# Patient Record
Sex: Female | Born: 1965 | Race: Black or African American | Hispanic: No | Marital: Single | Smoking: Current every day smoker
Health system: Southern US, Community
[De-identification: ages and names within clinical notes are randomized; demographics above are authoritative.]

## PROBLEM LIST (undated history)

## (undated) DIAGNOSIS — R011 Cardiac murmur, unspecified: Secondary | ICD-10-CM

## (undated) DIAGNOSIS — F32A Depression, unspecified: Secondary | ICD-10-CM

## (undated) DIAGNOSIS — T1491XA Suicide attempt, initial encounter: Secondary | ICD-10-CM

## (undated) DIAGNOSIS — F329 Major depressive disorder, single episode, unspecified: Secondary | ICD-10-CM

## (undated) DIAGNOSIS — Z21 Asymptomatic human immunodeficiency virus [HIV] infection status: Secondary | ICD-10-CM

## (undated) DIAGNOSIS — F319 Bipolar disorder, unspecified: Secondary | ICD-10-CM

## (undated) DIAGNOSIS — E7800 Pure hypercholesterolemia, unspecified: Secondary | ICD-10-CM

## (undated) DIAGNOSIS — I10 Essential (primary) hypertension: Secondary | ICD-10-CM

## (undated) DIAGNOSIS — K219 Gastro-esophageal reflux disease without esophagitis: Secondary | ICD-10-CM

## (undated) DIAGNOSIS — K859 Acute pancreatitis without necrosis or infection, unspecified: Secondary | ICD-10-CM

## (undated) HISTORY — PX: ECTOPIC PREGNANCY SURGERY: SHX613

---

## 1999-03-09 ENCOUNTER — Emergency Department (HOSPITAL_COMMUNITY): Admission: EM | Admit: 1999-03-09 | Discharge: 1999-03-09 | Payer: Self-pay | Admitting: Emergency Medicine

## 1999-03-10 ENCOUNTER — Emergency Department (HOSPITAL_COMMUNITY): Admission: EM | Admit: 1999-03-10 | Discharge: 1999-03-10 | Payer: Self-pay

## 1999-03-10 ENCOUNTER — Encounter: Payer: Self-pay | Admitting: Emergency Medicine

## 1999-05-24 ENCOUNTER — Emergency Department (HOSPITAL_COMMUNITY): Admission: EM | Admit: 1999-05-24 | Discharge: 1999-05-24 | Payer: Self-pay | Admitting: Emergency Medicine

## 1999-05-24 ENCOUNTER — Encounter: Payer: Self-pay | Admitting: *Deleted

## 1999-06-15 ENCOUNTER — Emergency Department (HOSPITAL_COMMUNITY): Admission: EM | Admit: 1999-06-15 | Discharge: 1999-06-15 | Payer: Self-pay | Admitting: Emergency Medicine

## 2004-12-29 ENCOUNTER — Emergency Department (HOSPITAL_COMMUNITY): Admission: EM | Admit: 2004-12-29 | Discharge: 2004-12-29 | Payer: Self-pay | Admitting: *Deleted

## 2005-01-02 ENCOUNTER — Ambulatory Visit: Payer: Self-pay | Admitting: Nurse Practitioner

## 2005-01-02 ENCOUNTER — Ambulatory Visit: Payer: Self-pay | Admitting: *Deleted

## 2005-02-02 ENCOUNTER — Emergency Department (HOSPITAL_COMMUNITY): Admission: EM | Admit: 2005-02-02 | Discharge: 2005-02-02 | Payer: Self-pay | Admitting: Family Medicine

## 2011-12-02 ENCOUNTER — Emergency Department (HOSPITAL_COMMUNITY)
Admission: EM | Admit: 2011-12-02 | Discharge: 2011-12-02 | Disposition: A | Discharge status: Other Acute Inpatient Hospital | Payer: PRIVATE HEALTH INSURANCE | Attending: Emergency Medicine | Admitting: Emergency Medicine

## 2011-12-02 ENCOUNTER — Emergency Department (HOSPITAL_COMMUNITY): Payer: PRIVATE HEALTH INSURANCE

## 2011-12-02 ENCOUNTER — Inpatient Hospital Stay (HOSPITAL_COMMUNITY)
Admission: AD | Admit: 2011-12-02 | Discharge: 2011-12-27 | DRG: 885 | Disposition: A | Discharge status: Home / Self Care | Payer: PRIVATE HEALTH INSURANCE | Source: Ambulatory Visit | Attending: Psychiatry | Admitting: Psychiatry

## 2011-12-02 ENCOUNTER — Encounter (HOSPITAL_COMMUNITY): Payer: Self-pay | Admitting: *Deleted

## 2011-12-02 DIAGNOSIS — F1420 Cocaine dependence, uncomplicated: Secondary | ICD-10-CM | POA: Diagnosis present

## 2011-12-02 DIAGNOSIS — Z21 Asymptomatic human immunodeficiency virus [HIV] infection status: Secondary | ICD-10-CM | POA: Diagnosis present

## 2011-12-02 DIAGNOSIS — F32A Depression, unspecified: Secondary | ICD-10-CM

## 2011-12-02 DIAGNOSIS — Z79899 Other long term (current) drug therapy: Secondary | ICD-10-CM

## 2011-12-02 DIAGNOSIS — Z91199 Patient's noncompliance with other medical treatment and regimen due to unspecified reason: Secondary | ICD-10-CM

## 2011-12-02 DIAGNOSIS — R21 Rash and other nonspecific skin eruption: Secondary | ICD-10-CM | POA: Insufficient documentation

## 2011-12-02 DIAGNOSIS — K219 Gastro-esophageal reflux disease without esophagitis: Secondary | ICD-10-CM | POA: Diagnosis present

## 2011-12-02 DIAGNOSIS — F6089 Other specific personality disorders: Secondary | ICD-10-CM | POA: Diagnosis present

## 2011-12-02 DIAGNOSIS — K5900 Constipation, unspecified: Secondary | ICD-10-CM | POA: Diagnosis present

## 2011-12-02 DIAGNOSIS — B9689 Other specified bacterial agents as the cause of diseases classified elsewhere: Secondary | ICD-10-CM | POA: Diagnosis present

## 2011-12-02 DIAGNOSIS — E7800 Pure hypercholesterolemia, unspecified: Secondary | ICD-10-CM | POA: Diagnosis present

## 2011-12-02 DIAGNOSIS — L298 Other pruritus: Secondary | ICD-10-CM | POA: Insufficient documentation

## 2011-12-02 DIAGNOSIS — F411 Generalized anxiety disorder: Secondary | ICD-10-CM | POA: Insufficient documentation

## 2011-12-02 DIAGNOSIS — F1410 Cocaine abuse, uncomplicated: Secondary | ICD-10-CM

## 2011-12-02 DIAGNOSIS — I10 Essential (primary) hypertension: Secondary | ICD-10-CM | POA: Diagnosis present

## 2011-12-02 DIAGNOSIS — F339 Major depressive disorder, recurrent, unspecified: Secondary | ICD-10-CM

## 2011-12-02 DIAGNOSIS — F329 Major depressive disorder, single episode, unspecified: Secondary | ICD-10-CM

## 2011-12-02 DIAGNOSIS — B20 Human immunodeficiency virus [HIV] disease: Secondary | ICD-10-CM

## 2011-12-02 DIAGNOSIS — Z9119 Patient's noncompliance with other medical treatment and regimen: Secondary | ICD-10-CM

## 2011-12-02 DIAGNOSIS — L2989 Other pruritus: Secondary | ICD-10-CM | POA: Insufficient documentation

## 2011-12-02 DIAGNOSIS — F10939 Alcohol use, unspecified with withdrawal, unspecified: Secondary | ICD-10-CM | POA: Diagnosis present

## 2011-12-02 DIAGNOSIS — F1020 Alcohol dependence, uncomplicated: Secondary | ICD-10-CM | POA: Diagnosis present

## 2011-12-02 DIAGNOSIS — N760 Acute vaginitis: Secondary | ICD-10-CM | POA: Diagnosis present

## 2011-12-02 DIAGNOSIS — F10239 Alcohol dependence with withdrawal, unspecified: Secondary | ICD-10-CM | POA: Diagnosis present

## 2011-12-02 DIAGNOSIS — F17200 Nicotine dependence, unspecified, uncomplicated: Secondary | ICD-10-CM | POA: Diagnosis present

## 2011-12-02 DIAGNOSIS — R011 Cardiac murmur, unspecified: Secondary | ICD-10-CM | POA: Diagnosis present

## 2011-12-02 DIAGNOSIS — F1010 Alcohol abuse, uncomplicated: Secondary | ICD-10-CM | POA: Insufficient documentation

## 2011-12-02 DIAGNOSIS — F1910 Other psychoactive substance abuse, uncomplicated: Secondary | ICD-10-CM

## 2011-12-02 DIAGNOSIS — F39 Unspecified mood [affective] disorder: Secondary | ICD-10-CM

## 2011-12-02 DIAGNOSIS — F1210 Cannabis abuse, uncomplicated: Secondary | ICD-10-CM

## 2011-12-02 DIAGNOSIS — F3130 Bipolar disorder, current episode depressed, mild or moderate severity, unspecified: Secondary | ICD-10-CM | POA: Insufficient documentation

## 2011-12-02 DIAGNOSIS — F429 Obsessive-compulsive disorder, unspecified: Secondary | ICD-10-CM | POA: Diagnosis present

## 2011-12-02 DIAGNOSIS — A6000 Herpesviral infection of urogenital system, unspecified: Secondary | ICD-10-CM | POA: Diagnosis present

## 2011-12-02 DIAGNOSIS — F332 Major depressive disorder, recurrent severe without psychotic features: Principal | ICD-10-CM | POA: Diagnosis present

## 2011-12-02 DIAGNOSIS — L989 Disorder of the skin and subcutaneous tissue, unspecified: Secondary | ICD-10-CM | POA: Diagnosis present

## 2011-12-02 HISTORY — DX: Cardiac murmur, unspecified: R01.1

## 2011-12-02 HISTORY — DX: Suicide attempt, initial encounter: T14.91XA

## 2011-12-02 HISTORY — DX: Pure hypercholesterolemia, unspecified: E78.00

## 2011-12-02 HISTORY — DX: Bipolar disorder, unspecified: F31.9

## 2011-12-02 HISTORY — DX: Acute pancreatitis without necrosis or infection, unspecified: K85.90

## 2011-12-02 HISTORY — DX: Depression, unspecified: F32.A

## 2011-12-02 HISTORY — DX: Asymptomatic human immunodeficiency virus (hiv) infection status: Z21

## 2011-12-02 HISTORY — DX: Major depressive disorder, single episode, unspecified: F32.9

## 2011-12-02 HISTORY — DX: Gastro-esophageal reflux disease without esophagitis: K21.9

## 2011-12-02 HISTORY — DX: Essential (primary) hypertension: I10

## 2011-12-02 LAB — COMPREHENSIVE METABOLIC PANEL
ALT: 14 U/L (ref 0–35)
AST: 22 U/L (ref 0–37)
Albumin: 3.8 g/dL (ref 3.5–5.2)
Alkaline Phosphatase: 66 U/L (ref 39–117)
BUN: 11 mg/dL (ref 6–23)
CO2: 23 mEq/L (ref 19–32)
Calcium: 9.1 mg/dL (ref 8.4–10.5)
Chloride: 104 mEq/L (ref 96–112)
Creatinine, Ser: 0.85 mg/dL (ref 0.50–1.10)
GFR calc Af Amer: >90 mL/min (ref >90)
GFR calc non Af Amer: 80 mL/min — ABNORMAL LOW (ref >90)
Glucose, Bld: 92 mg/dL (ref 70–99)
Potassium: 3.9 mEq/L (ref 3.5–5.1)
Sodium: 137 mEq/L (ref 135–145)
Total Bilirubin: 0.3 mg/dL (ref 0.3–1.2)
Total Protein: 7.5 g/dL (ref 6.0–8.3)

## 2011-12-02 LAB — WET PREP, GENITAL
Trich, Wet Prep: NONE SEEN
Yeast Wet Prep HPF POC: NONE SEEN

## 2011-12-02 LAB — URINALYSIS, ROUTINE W REFLEX MICROSCOPIC
Bilirubin Urine: NEGATIVE
Glucose, UA: NEGATIVE mg/dL
Hgb urine dipstick: NEGATIVE
Ketones, ur: NEGATIVE mg/dL
Leukocytes, UA: NEGATIVE
Nitrite: NEGATIVE
Protein, ur: NEGATIVE mg/dL
Specific Gravity, Urine: 1.025 (ref 1.005–1.030)
Urobilinogen, UA: 0.2 mg/dL (ref 0.0–1.0)
pH: 5 (ref 5.0–8.0)

## 2011-12-02 LAB — DIFFERENTIAL
Basophils Absolute: 0 10*3/uL (ref 0.0–0.1)
Basophils Relative: 0 % (ref 0–1)
Eosinophils Absolute: 0.2 10*3/uL (ref 0.0–0.7)
Eosinophils Relative: 4 % (ref 0–5)
Lymphocytes Relative: 32 % (ref 12–46)
Lymphs Abs: 1.5 10*3/uL (ref 0.7–4.0)
Monocytes Absolute: 0.3 10*3/uL (ref 0.1–1.0)
Monocytes Relative: 6 % (ref 3–12)
Neutro Abs: 2.7 10*3/uL (ref 1.7–7.7)
Neutrophils Relative %: 58 % (ref 43–77)

## 2011-12-02 LAB — CBC
HCT: 43.9 % (ref 36.0–46.0)
Hemoglobin: 14.6 g/dL (ref 12.0–15.0)
MCH: 30.9 pg (ref 26.0–34.0)
MCHC: 33.3 g/dL (ref 30.0–36.0)
MCV: 92.8 fL (ref 78.0–100.0)
Platelets: 302 10*3/uL (ref 150–400)
RBC: 4.73 MIL/uL (ref 3.87–5.11)
RDW: 12.6 % (ref 11.5–15.5)
WBC: 4.7 10*3/uL (ref 4.0–10.5)

## 2011-12-02 LAB — RAPID URINE DRUG SCREEN, HOSP PERFORMED
Amphetamines: NOT DETECTED
Barbiturates: NOT DETECTED
Benzodiazepines: NOT DETECTED
Cocaine: POSITIVE — AB
Opiates: NOT DETECTED
Tetrahydrocannabinol: NOT DETECTED

## 2011-12-02 LAB — RPR: RPR Ser Ql: NONREACTIVE

## 2011-12-02 LAB — PREGNANCY, URINE: Preg Test, Ur: NEGATIVE

## 2011-12-02 MED ORDER — SIMVASTATIN 10 MG PO TABS
10.0000 mg | ORAL_TABLET | Freq: Every day | ORAL | Status: DC
  Administered 2011-12-02: 10 mg via ORAL
  Filled 2011-12-02: qty 1

## 2011-12-02 MED ORDER — EFAVIRENZ-EMTRICITAB-TENOFOVIR 600-200-300 MG PO TABS
1.0000 | ORAL_TABLET | Freq: Every day | ORAL | Status: DC
  Administered 2011-12-02: 1 via ORAL
  Filled 2011-12-02: qty 1

## 2011-12-02 MED ORDER — DIPHENHYDRAMINE HCL 25 MG PO CAPS
25.0000 mg | ORAL_CAPSULE | Freq: Four times a day (QID) | ORAL | Status: DC | PRN
  Administered 2011-12-02: 25 mg via ORAL
  Filled 2011-12-02: qty 1

## 2011-12-02 MED ORDER — HYDROCHLOROTHIAZIDE 25 MG PO TABS
25.0000 mg | ORAL_TABLET | Freq: Every day | ORAL | Status: DC
  Administered 2011-12-02: 25 mg via ORAL
  Filled 2011-12-02: qty 1

## 2011-12-02 MED ORDER — ARIPIPRAZOLE 10 MG PO TABS
10.0000 mg | ORAL_TABLET | Freq: Every day | ORAL | Status: DC
  Administered 2011-12-02: 10 mg via ORAL
  Filled 2011-12-02: qty 1

## 2011-12-02 MED ORDER — ONDANSETRON 4 MG PO TBDP
4.0000 mg | ORAL_TABLET | Freq: Three times a day (TID) | ORAL | Status: DC | PRN
Start: 2011-12-02 — End: 2011-12-27
  Administered 2011-12-04 – 2011-12-26 (×6): 4 mg via ORAL
  Filled 2011-12-02 (×2): qty 1

## 2011-12-02 MED ORDER — CEFTRIAXONE SODIUM 250 MG IJ SOLR
250.0000 mg | Freq: Once | INTRAMUSCULAR | Status: AC
  Administered 2011-12-02: 250 mg via INTRAMUSCULAR
  Filled 2011-12-02: qty 250

## 2011-12-02 MED ORDER — RISPERIDONE 1 MG PO TABS
1.0000 mg | ORAL_TABLET | Freq: Once | ORAL | Status: AC
  Administered 2011-12-02: 1 mg via ORAL
  Filled 2011-12-02: qty 1

## 2011-12-02 MED ORDER — RISPERIDONE 1 MG PO TABS
1.0000 mg | ORAL_TABLET | Freq: Every day | ORAL | Status: DC
  Administered 2011-12-03 – 2011-12-04 (×2): 1 mg via ORAL
  Filled 2011-12-02 (×4): qty 1

## 2011-12-02 MED ORDER — QUETIAPINE FUMARATE ER 400 MG PO TB24
800.0000 mg | ORAL_TABLET | Freq: Every day | ORAL | Status: DC
  Filled 2011-12-02: qty 2

## 2011-12-02 MED ORDER — IBUPROFEN 600 MG PO TABS: 600.0000 mg | ORAL_TABLET | Freq: Three times a day (TID) | ORAL | Status: DC | PRN

## 2011-12-02 MED ORDER — AZITHROMYCIN 250 MG PO TABS
1000.0000 mg | ORAL_TABLET | Freq: Once | ORAL | Status: AC
  Administered 2011-12-02: 1000 mg via ORAL
  Filled 2011-12-02: qty 4

## 2011-12-02 MED ORDER — ALUM & MAG HYDROXIDE-SIMETH 200-200-20 MG/5ML PO SUSP: 30.0000 mL | ORAL | Status: DC | PRN

## 2011-12-02 MED ORDER — ACETAMINOPHEN 325 MG PO TABS
650.0000 mg | ORAL_TABLET | Freq: Four times a day (QID) | ORAL | Status: DC | PRN
  Administered 2011-12-08 – 2011-12-19 (×8): 650 mg via ORAL

## 2011-12-02 MED ORDER — METRONIDAZOLE 500 MG PO TABS
2000.0000 mg | ORAL_TABLET | Freq: Once | ORAL | Status: AC
  Administered 2011-12-02: 2000 mg via ORAL
  Filled 2011-12-02: qty 4

## 2011-12-02 MED ORDER — LORAZEPAM 1 MG PO TABS: 1.0000 mg | ORAL_TABLET | Freq: Three times a day (TID) | ORAL | Status: DC | PRN

## 2011-12-02 MED ORDER — ACETAMINOPHEN 325 MG PO TABS: 650.0000 mg | ORAL_TABLET | ORAL | Status: DC | PRN

## 2011-12-02 MED ORDER — MAGNESIUM HYDROXIDE 400 MG/5ML PO SUSP
30.0000 mL | Freq: Every day | ORAL | Status: DC | PRN
  Administered 2011-12-07 – 2011-12-16 (×5): 30 mL via ORAL

## 2011-12-02 MED ORDER — GABAPENTIN 300 MG PO CAPS
300.0000 mg | ORAL_CAPSULE | Freq: Three times a day (TID) | ORAL | Status: DC
  Administered 2011-12-02 (×2): 300 mg via ORAL
  Filled 2011-12-02 (×2): qty 1

## 2011-12-02 MED ORDER — EFAVIRENZ-EMTRICITAB-TENOFOVIR 600-200-300 MG PO TABS
1.0000 | ORAL_TABLET | Freq: Every day | ORAL | Status: DC
  Administered 2011-12-03 – 2011-12-23 (×21): 1 via ORAL
  Filled 2011-12-02 (×22): qty 1

## 2011-12-02 MED ORDER — DIPHENHYDRAMINE HCL 50 MG PO CAPS
50.0000 mg | ORAL_CAPSULE | Freq: Every evening | ORAL | Status: DC | PRN
  Filled 2011-12-02: qty 1

## 2011-12-02 MED ORDER — QUETIAPINE FUMARATE ER 400 MG PO TB24
800.0000 mg | ORAL_TABLET | Freq: Every day | ORAL | Status: DC
  Administered 2011-12-02: 800 mg via ORAL
  Filled 2011-12-02: qty 2

## 2011-12-02 MED ORDER — ONDANSETRON HCL 4 MG PO TABS
4.0000 mg | ORAL_TABLET | Freq: Three times a day (TID) | ORAL | Status: DC | PRN
  Administered 2011-12-02: 4 mg via ORAL
  Filled 2011-12-02: qty 1

## 2011-12-02 NOTE — ED Notes (Signed)
Report given  to nurse in Psych ED Toniann Fail, RN. Patient transferred to room 39

## 2011-12-02 NOTE — ED Notes (Signed)
Full rainbow sent to lab.  

## 2011-12-02 NOTE — Progress Notes (Signed)
Patient ID: Lacey Moses, female   DOB: [birth year], 46 y.o.   MRN: [id number] Patient admitted voluntarily for help with "Thirty something years" of substance abuse. Patient has been abusing ETOH/THC/cocaine. Denies SI/HI and AVH. The patient was very irritable during the admission process and was further annoyed by basic questions posed by Clinical research associate. She reported not having slept and was feeling very poorly. The patient was able to complete the admission process with support from staff. ED reported that patient had received several medications prior to arriving at [facility] including seroquel XR 800 mg, Risperdal 1mg , Zofran 4 mg, Neurontin 300 mg, and Atripla. Patient was concerned over not receiving her acyclovir and on call MD was contacted for further orders. The patient will be on the 400 hall. No signs of psychosis evident on admission. The patient expressed a need to use the phone and to get some sleep.

## 2011-12-02 NOTE — ED Notes (Signed)
ACT into see 

## 2011-12-02 NOTE — ED Notes (Signed)
Pt wearing wig-wig searched

## 2011-12-02 NOTE — ED Notes (Signed)
Pt from home with reports of requesting detox from alcohol, marijuana and cocaine. Pt reports last using cocaine and alcohol 3 days ago and marijuana a week ago. Pt cooperative but easily agitated at present. Pt denies SI/HI at present but reports suicide attempt 2 months ago by walking out in front of cars. Pt is homeless and has not been taking meds.

## 2011-12-02 NOTE — Tx Team (Signed)
Initial Interdisciplinary Treatment Plan  PATIENT STRENGTHS: (choose at least two) Ability for insight Average or above average intelligence Capable of independent living General fund of knowledge Religious Affiliation  PATIENT STRESSORS: Financial difficulties Health problems Substance abuse   PROBLEM LIST: Problem List/Patient Goals Date to be addressed Date deferred Reason deferred Estimated date of resolution        Substance Abuse     12/03/11           Major Depressive Disorder      12/03/11                                                DISCHARGE CRITERIA:  Ability to meet basic life and health needs Adequate post-discharge living arrangements Improved stabilization in mood, thinking, and/or behavior Medical problems require only outpatient monitoring Motivation to continue treatment in a less acute level of care Need for constant or close observation no longer present Reduction of life-threatening or endangering symptoms to within safe limits Safe-care adequate arrangements made Verbal commitment to aftercare and medication compliance Withdrawal symptoms are absent or subacute and managed without 24-hour nursing intervention  PRELIMINARY DISCHARGE PLAN: Return to previous living arrangement  PATIENT/FAMIILY INVOLVEMENT: This treatment plan has been presented to and reviewed with the patient, Lacey Moses, and/or family member.  The patient and family have been given the opportunity to ask questions and make suggestions.  Fransisca Kaufmann ANN 12/02/2011, 11:41 PM

## 2011-12-02 NOTE — ED Notes (Signed)
Pt belongings bag is locked in pt room cabinet #5
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
## 2011-12-02 NOTE — ED Notes (Signed)
Pt reports that she needs to be checked for STDs, Dr. Rubin Payor made aware and pt moved to room 5 for possible pelvic exam.

## 2011-12-02 NOTE — ED Notes (Signed)
1 bag locked in locker # 39 

## 2011-12-02 NOTE — [facility] Counselor (Signed)
Pt accepted to [facility], Jonnalagadda MD to [place] MD, bed 401-2. RN and EDP notified. All support paperwork faxed to [facility] and originals placed in pt's chart.

## 2011-12-02 NOTE — ED Provider Notes (Signed)
History     CSN: [id number]  Arrival date & time 12/02/11  1345   First MD Initiated Contact with Patient 12/02/11 1502      Chief Complaint  Patient presents with  . V70.1    (Consider location/radiation/quality/duration/timing/severity/associated sxs/prior treatment) The history is provided by the patient.   patient is requesting detox off of cocaine alcohol and marijuana. she primarily uses cocaine. She will also use alcohol and marijuana when she can get them. She she is now homeless after a breakup with her fianc and her relapse into drug use. She also has stopped taking all of her medications. She's not currently suicidal, but she is depressed. She has previous suicide attempts by walking from the cars. No fevers. No cough. No nausea vomiting diarrhea. He has had a rash for an unknown reason amount of time. She states that she got after she slept on an outside couch that you take $10 to sleep on. The rash itches. Patient now states that she wants to be checked for STDs.  Past Medical History  Diagnosis Date  . Depression   . Bipolar 1 disorder   . Suicide attempt   . HIV positive   . Hypertension   . High cholesterol   . Heart murmur   . Pancreatitis   . GERD (gastroesophageal reflux disease)     Past Surgical History  Procedure Date  . Ectopic pregnancy surgery     History reviewed. No pertinent family history.  History  Substance Use Topics  . Smoking status: Current Everyday Smoker -- 1.0 packs/day    Types: Cigarettes  . Smokeless tobacco: Never Used  . Alcohol Use: Yes     everything daily    OB History    Grav Para Term Preterm Abortions TAB SAB Ect Mult Living                  Review of Systems  Constitutional: Negative for activity change and appetite change.  HENT: Negative for neck stiffness.   Eyes: Negative for pain.  Respiratory: Negative for chest tightness and shortness of breath.   Cardiovascular: Negative for chest pain and leg  swelling.  Gastrointestinal: Negative for nausea, vomiting, abdominal pain and diarrhea.  Genitourinary: Negative for flank pain, vaginal bleeding and vaginal discharge.  Musculoskeletal: Negative for back pain.  Skin: Positive for rash.  Neurological: Negative for weakness, numbness and headaches.  Psychiatric/Behavioral: Negative for suicidal ideas and behavioral problems. The patient is nervous/anxious.     Allergies  Review of patient's allergies indicates no known allergies.  Home Medications   Current Outpatient Rx  Name Route Sig Dispense Refill  . ARIPIPRAZOLE 10 MG PO TABS Oral Take 10 mg by mouth daily.    . EFAVIRENZ-EMTRICITAB-TENOFOVIR 600-200-300 MG PO TABS Oral Take 1 tablet by mouth at bedtime.    Marland Kitchen FLUOCINONIDE 0.05 % EX SOLN Topical Apply 1 application topically every evening.    Marland Kitchen GABAPENTIN 300 MG PO CAPS Oral Take 300 mg by mouth 3 (three) times daily.    Marland Kitchen HYDROCHLOROTHIAZIDE 25 MG PO TABS Oral Take 25 mg by mouth daily.    Marland Kitchen PRAVASTATIN SODIUM 20 MG PO TABS Oral Take 20 mg by mouth daily.    . QUETIAPINE FUMARATE ER 400 MG PO TB24 Oral Take 800 mg by mouth at bedtime.    . TRETINOIN 0.025 % EX CREA Topical Apply 1 application topically at bedtime.      BP 115/73  Pulse 83  Temp(Src) 98.4  F (36.9 C) (Oral)  Resp 16  Wt 172 lb (78.019 kg)  SpO2 98%  Physical Exam  Nursing note and vitals reviewed. Constitutional: She is oriented to person, place, and time. She appears well-developed and well-nourished.  HENT:  Head: Normocephalic and atraumatic.  Eyes: EOM are normal. Pupils are equal, round, and reactive to light.  Neck: Normal range of motion. Neck supple.  Cardiovascular: Normal rate, regular rhythm and normal heart sounds.   No murmur heard. Pulmonary/Chest: Effort normal and breath sounds normal. No respiratory distress. She has no wheezes. She has no rales.  Abdominal: Soft. Bowel sounds are normal. She exhibits no distension. There is no  tenderness. There is no rebound and no guarding.  Musculoskeletal: Normal range of motion.  Neurological: She is alert and oriented to person, place, and time. No cranial nerve deficit.  Skin: Skin is warm and dry. Rash noted.       Patient has multiple small raise areas on her arms and trunkk. Some have been scratched and the top removed. No purulence.  Psychiatric: Her speech is normal.       Patient appears somewhat depressed and anxious.    ED Course  Procedures (including critical care time)  Labs Reviewed  COMPREHENSIVE METABOLIC PANEL - Abnormal; Notable for the following:    GFR calc non Af Amer 80 (*)    All other components within normal limits  URINE RAPID DRUG SCREEN (HOSP PERFORMED) - Abnormal; Notable for the following:    Cocaine POSITIVE (*)    All other components within normal limits  WET PREP, GENITAL - Abnormal; Notable for the following:    Clue Cells Wet Prep HPF POC FEW (*)    WBC, Wet Prep HPF POC FEW (*)    All other components within normal limits  CBC  DIFFERENTIAL  URINALYSIS, ROUTINE W REFLEX MICROSCOPIC  PREGNANCY, URINE  T-HELPER CELLS (CD4) COUNT  GC/CHLAMYDIA PROBE AMP, GENITAL  RPR   Dg Chest 2 View  12/02/2011  *RADIOLOGY REPORT*  Clinical Data: Cough, weakness.  HIV.  CHEST - 2 VIEW  Comparison: None.  Findings: Heart is normal in size.  Lungs are free of focal consolidations and pleural effusions.  There is mild prominence of interstitial markings.  No edema. Visualized osseous structures have a normal appearance.  IMPRESSION: No evidence for acute cardiopulmonary abnormality.  Original Report Authenticated By: Patterson Hammersmith, M.D.     1. Substance abuse   2. Depression   3. HIV (human immunodeficiency virus infection)       MDM  Patient is HIV positive has not been taking medications. She's here requesting detox from alcohol marijuana and cocaine. She has some depression but no clear suicidal thoughts. She also has a rash that may  be related to cocaine use or to sleeping on a couch. Patient later requested testing for STD. Pelvic exam was done and patient will be treated with Rocephin azithromycin and Flagyl. CD4 count was sent, which is need to be followed. Patient will be seen by ACT team. Patient has been accepted at [facility] by Dr Sandie Ano R. Rubin Payor, MD 12/02/11 2205

## 2011-12-03 DIAGNOSIS — F1020 Alcohol dependence, uncomplicated: Secondary | ICD-10-CM | POA: Diagnosis present

## 2011-12-03 DIAGNOSIS — F1420 Cocaine dependence, uncomplicated: Secondary | ICD-10-CM

## 2011-12-03 DIAGNOSIS — F1210 Cannabis abuse, uncomplicated: Secondary | ICD-10-CM | POA: Diagnosis present

## 2011-12-03 DIAGNOSIS — F1410 Cocaine abuse, uncomplicated: Secondary | ICD-10-CM | POA: Diagnosis present

## 2011-12-03 LAB — GC/CHLAMYDIA PROBE AMP, GENITAL
Chlamydia, DNA Probe: NEGATIVE
GC Probe Amp, Genital: NEGATIVE

## 2011-12-03 MED ORDER — CHLORDIAZEPOXIDE HCL 25 MG PO CAPS
25.0000 mg | ORAL_CAPSULE | Freq: Three times a day (TID) | ORAL | Status: AC
  Administered 2011-12-05 (×3): 25 mg via ORAL
  Filled 2011-12-03 (×4): qty 1

## 2011-12-03 MED ORDER — SIMVASTATIN 10 MG PO TABS
10.0000 mg | ORAL_TABLET | Freq: Every day | ORAL | Status: DC
  Administered 2011-12-03 – 2011-12-26 (×24): 10 mg via ORAL
  Filled 2011-12-03: qty 14
  Filled 2011-12-03 (×27): qty 1

## 2011-12-03 MED ORDER — CHLORDIAZEPOXIDE HCL 25 MG PO CAPS
25.0000 mg | ORAL_CAPSULE | Freq: Four times a day (QID) | ORAL | Status: AC
  Administered 2011-12-03 – 2011-12-04 (×6): 25 mg via ORAL
  Filled 2011-12-03 (×5): qty 1

## 2011-12-03 MED ORDER — VITAMIN B-1 100 MG PO TABS
100.0000 mg | ORAL_TABLET | Freq: Every day | ORAL | Status: DC
  Administered 2011-12-04 – 2011-12-25 (×22): 100 mg via ORAL
  Filled 2011-12-03 (×24): qty 1

## 2011-12-03 MED ORDER — GABAPENTIN 300 MG PO CAPS
300.0000 mg | ORAL_CAPSULE | Freq: Three times a day (TID) | ORAL | Status: DC
  Administered 2011-12-03 – 2011-12-05 (×7): 300 mg via ORAL
  Filled 2011-12-03 (×12): qty 1

## 2011-12-03 MED ORDER — THIAMINE HCL 100 MG/ML IJ SOLN
100.0000 mg | Freq: Once | INTRAMUSCULAR | Status: AC
  Administered 2011-12-03: 100 mg via INTRAMUSCULAR

## 2011-12-03 MED ORDER — HYDROCHLOROTHIAZIDE 25 MG PO TABS
25.0000 mg | ORAL_TABLET | Freq: Every day | ORAL | Status: DC
  Administered 2011-12-03 – 2011-12-27 (×25): 25 mg via ORAL
  Filled 2011-12-03 (×9): qty 1
  Filled 2011-12-03: qty 14
  Filled 2011-12-03 (×18): qty 1

## 2011-12-03 MED ORDER — DIPHENHYDRAMINE HCL 50 MG PO CAPS
50.0000 mg | ORAL_CAPSULE | Freq: Four times a day (QID) | ORAL | Status: DC | PRN
  Administered 2011-12-03 – 2011-12-24 (×10): 50 mg via ORAL
  Filled 2011-12-03 (×3): qty 1

## 2011-12-03 MED ORDER — FLUOCINONIDE 0.05 % EX SOLN
1.0000 "application " | Freq: Every evening | CUTANEOUS | Status: DC
  Administered 2011-12-03 – 2011-12-09 (×7): 1 via TOPICAL

## 2011-12-03 MED ORDER — ADULT MULTIVITAMIN W/MINERALS CH
1.0000 | ORAL_TABLET | Freq: Every day | ORAL | Status: DC
  Administered 2011-12-03 – 2011-12-27 (×25): 1 via ORAL
  Filled 2011-12-03 (×27): qty 1

## 2011-12-03 MED ORDER — TRETINOIN 0.025 % EX CREA
1.0000 "application " | TOPICAL_CREAM | Freq: Every day | CUTANEOUS | Status: DC
  Administered 2011-12-04 – 2011-12-26 (×11): 1 via TOPICAL

## 2011-12-03 MED ORDER — VALACYCLOVIR HCL 500 MG PO TABS
500.0000 mg | ORAL_TABLET | Freq: Every day | ORAL | Status: DC
  Administered 2011-12-03: 500 mg via ORAL
  Filled 2011-12-03 (×3): qty 1

## 2011-12-03 MED ORDER — CHLORDIAZEPOXIDE HCL 25 MG PO CAPS
25.0000 mg | ORAL_CAPSULE | Freq: Every day | ORAL | Status: AC
  Administered 2011-12-07: 25 mg via ORAL
  Filled 2011-12-03: qty 1

## 2011-12-03 MED ORDER — CHLORDIAZEPOXIDE HCL 25 MG PO CAPS
25.0000 mg | ORAL_CAPSULE | ORAL | Status: AC
  Administered 2011-12-06 (×2): 25 mg via ORAL
  Filled 2011-12-03 (×2): qty 1

## 2011-12-03 MED ORDER — QUETIAPINE FUMARATE ER 400 MG PO TB24
800.0000 mg | ORAL_TABLET | Freq: Every day | ORAL | Status: DC
  Administered 2011-12-03 – 2011-12-10 (×8): 800 mg via ORAL
  Filled 2011-12-03 (×9): qty 2

## 2011-12-03 MED ORDER — VALACYCLOVIR HCL 500 MG PO TABS
1000.0000 mg | ORAL_TABLET | Freq: Every day | ORAL | Status: DC
  Administered 2011-12-04 – 2011-12-27 (×24): 1000 mg via ORAL
  Filled 2011-12-03 (×18): qty 2
  Filled 2011-12-03: qty 28
  Filled 2011-12-03 (×7): qty 2

## 2011-12-03 NOTE — Progress Notes (Signed)
[facility] Group Notes:  (Counselor/Nursing/MHT/Case Management/Adjunct)  12/03/2011 1:09 PM  Type of Therapy:  Group Therapy  Participation Level:  Did Not Attend   Lacey Moses 12/03/2011, 1:09 PM
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
## 2011-12-03 NOTE — Progress Notes (Signed)
In dayroom watching TV with peers on approach. Appears blunted and depressed. Calm and cooperative with assessment . No acute distress noted. Has been visible in milieu but generally withdrawn to self. States she has had an ok day. Had multiple questions about her medications and indications. Writer was able to go over list with pt and explain indications. Pt verbalized good understanding of information and thanked Clinical research associate for taking the time to explain her meds to her. Support and encouragement provided. Continues to c/o itching on arms. Several small sores noted. Practitioner aware. Denied pain. Denied SI/HI/AVH and contracts for safety. Offered no additional questions or concerns. POC and medications for the shift reviewed and understanding verbalized. Safety has been maintained with Q15 minute observation. Will continue current POC.

## 2011-12-03 NOTE — [facility] Suicide Risk Assessment (Signed)
Suicide Risk Assessment  Admission Assessment      Demographic factors:   See chart.  Current Mental Status: Patient seen and evaluated. Chart reviewed. Patient stated that her mood was "not good". Her affect was mood congruent and anxious. She denied any current thoughts of self injurious behavior, suicidal ideation or homicidal ideation. Complaint of AVH, worse when using alcohol and cocaine. There was no paranoia or delusional thought processes noted.  Thought process was tangential.  Mild psychomotor agitation noted. Speech was presurred. Eye contact was poor. Judgment and insight are limited.  Patient has been up and engaged on the unit, but does not "like the [address]".  No acute safety concerns reported from team.    Loss Factors:  Decline in physical health;Financial problems / change in socioeconomic status; medical, psychiatric and SUDs - comorbidity  Historical Factors: Prior suicide attempts (OD, running into traffic);Impulsivity; hx assault charge  Risk Reduction Factors:  Positive social support;Positive therapeutic relationship;open to CD residential Tx s/p mental health stabilization for SUDs; willing to take meds  CLINICAL FACTORS: Cocaine Dependence; Alcohol Dependence & W/D; Cannabis Abuse; Mood Disorder NOS; HIV; HL; HTN; Genital Herpes; Hair Loss; Unidentified Skin Lesions  COGNITIVE FEATURES THAT CONTRIBUTE TO RISK: limited insight; impulsivity.  SUICIDE/VIOLENCE RISK: Pt viewed as a chronic increased risk of harm to self and others in light of her past hx and risk factors.  No acute safety concerns on the unit.  Pt contracting for safety and in need of crisis stabilization, detox & Tx.  Pt a "Do Not Admit" as well.  VS:  Filed Vitals:   12/03/11 0643  BP: 107/74  Pulse: 89  Temp: 97.7 F (36.5 C)  Resp: 16    PLAN OF CARE: Pt admitted for crisis stabilization, detox and treatment.  Please see orders.   Medications reviewed with pt and medication education  provided. Pt restarted on outpt meds.  Reportedly using alcohol and cocaine daily.  Denied hx w/d seizures or DTs. Placed on Librium protocol for additional support. May discontinue if not indicated upon further evaluation. VSS. Will continue q15 minute checks per unit protocol.  No clinical indication for one on one level of observation at this time.  Pt contracting for safety.  Mental health treatment, medication management and continued sobriety will mitigate against the increased risk of harm to self and others.  Discussed the importance of recovery with pt, as well as, tools to move forward in a healthy & safe manner.  Pt open to residential Tx s/p stabilization.  Pt agreeable with the plan.  Discussed with the team. Potential IM consult needed, will discuss with Dr. Allena Katz in am.  Lupe Carney 12/03/2011, 3:49 PM

## 2011-12-03 NOTE — Tx Team (Signed)
Decision was made to wait to see this patient in treatment team on 12/04/11.  Ambrose Mantle, LCSW 12/03/2011, 11:42 AM    Interdisciplinary Treatment Plan Update (Adult)  Date:  12/03/2011  Time Reviewed:  10:15AM-11:15AM  Progress in Treatment: Attending groups:   Participating in groups:     Taking medication as prescribed:     Tolerating medication:    Family/Significant other contact made:   Patient understands diagnosis:    Discussing patient identified problems/goals with staff:    Medical problems stabilized or resolved:    Denies suicidal/homicidal ideation:   Issues/concerns per patient self-inventory:    Other:    New problem(s) identified:   Reason for Continuation of Hospitalization:   Interventions implemented related to continuation of hospitalization:  Medication monitoring and adjustment, safety checks Q15 min., suicide risk assessment, group therapy, psychoeducation, collateral contact, aftercare planning, ongoing physician assessments, medication education  Additional comments:  Not applicable  Estimated length of stay:    Discharge Plan:    New goal(s):  Not applicable  Review of initial/current patient goals per problem list:   1.  Goal(s):  Deny SI for 48 hours prior to D/C.  Met:    Target date:  By Discharge   As evidenced by:    2.  Goal(s):  Decide if & how to address substance abuse issues at discharge.  Met:    Target date:  By Discharge   As evidenced by:    3.  Goal(s):  Reduce auditory and visual hallucinations to baseline per patient and collateral report.  Met:    Target date:  By Discharge   As evidenced by:    4.  Goal(s):  Reduce depression to no greater than 3 at discharge.  Met:    Target date:  By Discharge   As evidenced by:    Attendees: Patient:  Lacey Moses  12/03/2011 10:15AM-11:15AM  Family:     Physician:     Nursing:   Neill Loft, RN 12/03/2011 10:15AM -11:15AM   Case Manager:  Ambrose Mantle, LCSW 12/03/2011 10:15AM-11:15AM  Counselor:  Veto Kemps, MT-BC 12/03/2011 10:15AM-11:15AM  Other:   Verne Spurr, PA 12/03/2011 10:15AM-11:15AM  Other:      Other:      Other:       Scribe for Treatment Team:   Sarina Ser, 12/03/2011, 10:15AM-11:15AM

## 2011-12-03 NOTE — H&P (Signed)
Pt seen and evaluated upon admission.  Completed Admission Suicide Risk Assessment.  See orders.  Pt agreeable with plan.  Discussed with team.  See revised clinical factor(s) list/SRA.
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
## 2011-12-04 DIAGNOSIS — F1210 Cannabis abuse, uncomplicated: Secondary | ICD-10-CM

## 2011-12-04 DIAGNOSIS — Z21 Asymptomatic human immunodeficiency virus [HIV] infection status: Secondary | ICD-10-CM | POA: Diagnosis present

## 2011-12-04 DIAGNOSIS — B9689 Other specified bacterial agents as the cause of diseases classified elsewhere: Secondary | ICD-10-CM | POA: Diagnosis present

## 2011-12-04 DIAGNOSIS — F1420 Cocaine dependence, uncomplicated: Secondary | ICD-10-CM

## 2011-12-04 DIAGNOSIS — F39 Unspecified mood [affective] disorder: Secondary | ICD-10-CM

## 2011-12-04 LAB — T-HELPER CELLS (CD4) COUNT (NOT AT ARMC)
CD4 % Helper T Cell: 43 % (ref 33–55)
CD4 T Cell Abs: 610 uL (ref 400–2700)

## 2011-12-04 MED ORDER — CEPHALEXIN 500 MG PO CAPS
500.0000 mg | ORAL_CAPSULE | Freq: Three times a day (TID) | ORAL | Status: DC
  Administered 2011-12-04 – 2011-12-05 (×5): 500 mg via ORAL
  Filled 2011-12-04 (×14): qty 1

## 2011-12-04 MED ORDER — METRONIDAZOLE 500 MG PO TABS
500.0000 mg | ORAL_TABLET | Freq: Two times a day (BID) | ORAL | Status: AC
  Administered 2011-12-04 – 2011-12-06 (×4): 500 mg via ORAL
  Filled 2011-12-04 (×6): qty 1

## 2011-12-04 NOTE — Discharge Planning (Signed)
Met with patient for the first time today in Treatment Team.  She expressed that she wants to go to a alcohol/drug rehabilization program at discharge.  Lacey Moses is a likely candidate if they will take her insurance, Inclusive.  Otherwise, [facility] may be the best solution.  Also, because patient is homeless, [facility] is a possibility.  No case management needs today.  Ambrose Mantle, LCSW 12/04/2011, 2:10 PM

## 2011-12-04 NOTE — Progress Notes (Addendum)
In hallway on approach. Appears blunted and depressed. Calm and cooperative with assessment. No acute distress noted. States she had a good day. When asked what qualifies a good day, She replied, "I got to see my mentor and she bought me some clothes and things.". Continues to have questions about the medications she is prescribed to take here. Writer reviewed information and pt appeared to have a clearer understanding of her medications. No c/o withdrawal symptoms. Denies Pain. Denies SI/HI/AVH and contracts for safety. POC for the shift reviewed and understanding verbalized. Safety has been maintained with Q15 minute observation. Will continue current POC.

## 2011-12-04 NOTE — Progress Notes (Addendum)
Patient cooperative and denies SI/HI.    Patient states they slept poorly last night and energy level is low and some of her medications are making her dizzy. Patient rates feelings of depression 10/10 and hopelessness 10/10. Being homeless and having to move from place to place is a major stressor for her. Patient contracts to safety on the unit patient was offered support and encouragement. Her CIWA was a 6 and she had some agitation and nausea earlier in the shift which she was treated for.  Also she has two small scratches to right forearm that were bleeding.  They were cleaned and antibiotic ointment applied and cover with bandage.  Angeline Slim RN, BSN 12/04/2011, 1:35 PM

## 2011-12-04 NOTE — Tx Team (Signed)
Interdisciplinary Treatment Plan Update (Adult)  Date:  12/04/2011  Time Reviewed:  10:15AM-11:15AM  Progress in Treatment: Attending groups:  No, new, can program over on 300 hall Participating in groups:    N/A Taking medication as prescribed:    Yes Tolerating medication:   Yes Family/Significant other contact made:  New, still needed Patient understands diagnosis:   Yes, limited insight, fair judgment Discussing patient identified problems/goals with staff:   Yes Medical problems stabilized or resolved:   Yes Denies suicidal/homicidal ideation:  Denies today Issues/concerns per patient self-inventory:   None Other:    New problem(s) identified: No, Describe:    Reason for Continuation of Hospitalization: Depression Hallucinations Medication stabilization Withdrawal symptoms  Interventions implemented related to continuation of hospitalization:  Medication monitoring and adjustment, safety checks Q15 min., suicide risk assessment, group therapy, psychoeducation, collateral contact, aftercare planning, ongoing physician assessments, medication education  Additional comments:  Not applicable  Estimated length of stay:  4-5 days  Discharge Plan:  Go to drug rehab.  New goal(s):  Not applicable  Review of initial/current patient goals per problem list:   1.  Goal(s):  Deny SI for 48 hours prior to D/C.  Met:  No  Target date:  By Discharge   As evidenced by:  Denies SI today, but needs 48 hours  2.  Goal(s):  Decide if & how to address substance abuse issues at discharge.  Met:  Yes  Target date:  By Discharge   As evidenced by:  Would like to go to a 28-day treatment program following d/c.  3.  Goal(s):  Reduce auditory and visual hallucinations to baseline per patient and collateral report.  Met:  Yes  Target date:  By Discharge   As evidenced by:  Denies today  4.  Goal(s):  Reduce depression to no greater than 3 at discharge.  Met:  No  Target date:   By Discharge   As evidenced by:  "9" today  5.  Goal(s):  Successfully complete detox from alcohol.  Met:  No  Target date:  By Discharge   As evidenced by:  Still ongoing, approximately 4 more days   Attendees: Patient:  Lacey Moses  12/04/2011 10:15AM-11:15AM  Family:     Physician:  Dr. Harvie Heck Readling 12/04/2011 10:15AM-11:15AM  Nursing:   Neill Loft, RN 12/04/2011 10:15AM -11:15AM   Case Manager:  Ambrose Mantle, LCSW 12/04/2011 10:15AM-11:15AM  Counselor:  Veto Kemps, MT-BC 12/04/2011 10:15AM-11:15AM  Other:   Verne Spurr, PA 12/04/2011 10:15AM-11:15AM  Other:      Other:      Other:       Scribe for Treatment Team:   Sarina Ser, 12/04/2011, 10:15AM-11:15AM

## 2011-12-04 NOTE — Progress Notes (Signed)
[facility] Group Notes:  (Counselor/Nursing/MHT/Case Management/Adjunct)  12/04/2011 2:09 PM  Type of Therapy:  Group Therapy  Participation Level:  Did Not Attend      Lacey Moses 12/04/2011, 2:09 PM

## 2011-12-04 NOTE — Progress Notes (Signed)
Patient ID: Lacey Moses, female   DOB: 06/08/1966, 46 y.o.   MRN: [id number]  [facility] MD Progress Note                                         12/04/2011    Lacey Moses 1965-07-20    [id number] Hospital day #2  Dx: : Cocaine Dependence; Alcohol Dependence & W/D; Cannabis Abuse; Mood Disorder NOS; HIV; HL; HTN; Genital Herpes; Hair Loss; Unidentified Skin Lesions The patient was seen today and reports the following:  Sleep: poor Appetite: ok  Mild>(1-10) >Severe  Hopelessness (1-10): 9 Depression (1-10): 9 Anxiety (1-10): 8 Suicidal Ideation: The patient denies suicidal ideation. Plan: None Intent: None Means:  None Homicidal Ideation: The patient denies homicidal ideation. Plan: None Intent: None Means: None  Eye Contact: Good.  General Appearance/Behavior:  disheveled Motor Behavior: normal Speech: clear  Mental Status: oriented x 3  Level of Consciousness: alert  Mood: anxious and depressed Affect: congruent  Thought Process: linear Thought Content:  Denies AH/VH Perception: intact  Judgment: improving Insight: improving Cognition: at least average  Sleep: Number of Hours:.5.5   Filed Vitals:   12/04/11 1154  BP: 117/84  Pulse: 104  Temp: 97.7 F (36.5 C)  Resp: 18       . cephALEXin  500 mg Oral TID WC & HS  . chlordiazePOXIDE  25 mg Oral QID   Followed by  . chlordiazePOXIDE  25 mg Oral TID   Followed by  . chlordiazePOXIDE  25 mg Oral BH-qamhs   Followed by  . chlordiazePOXIDE  25 mg Oral Daily  . efavirenz-emtrictabine-tenofovir  1 tablet Oral QHS  . fluocinonide  1 application Topical QPM  . gabapentin  300 mg Oral TID  . hydrochlorothiazide  25 mg Oral Daily  . mulitivitamin with minerals  1 tablet Oral Daily  . QUEtiapine  800 mg Oral QHS  . risperiDONE  1 mg Oral QHS  . simvastatin  10 mg Oral q1800  . thiamine  100 mg Intramuscular Once  . thiamine  100 mg Oral Daily  . tretinoin  1 application Topical QHS  . valACYclovir  1,000  mg Oral Daily      No results found for this or any previous visit (from the past 48 hour(s)).  ROS:    Constitutional: WDWN AAF NAD   GI: Negative for N,V,D,C   Neuro: Negative for dizziness, blurred vision, visual changes, headaches   Resp: Negative for wheezing, SOB, cough   Cardio: Negative for CP, diaphoresis, fatigue   MSK: Negative for joint pain, swelling, DROM, or ambulatory difficulties.   DERM:  The patient reports small lesions on her arms and legs that are pus filled, and she has excoriated them.  She thinks her Retina A is for this.  Time was spent with the patient discussing her symptoms and her treatment options. She is ready to consider treatment for her substance abuse upon discharge and has asked for assistance in finding placement in treatment program.  Treatment Plan Summary:  1. Daily contact with patient to assess and evaluate symptoms and progress in treatment.  2. Medication management  3. The patient will deny suicidal ideations or homicidal ideations for 48 hours prior to discharge and have a depression and anxiety rating of 3 or less. The patient will also deny any auditory or visual hallucinations or delusional thinking.  4. The  patient will deny any symptoms of substance withdrawal at time of discharge.  Treatment Plan: 1. Continue the Librium detox protocol. 2. Continue the Atripla 1 tablet each day. 3. Continue the Valcyclovir 1g po each day. 4. Continue the Lidex for hair loss each day. 5. Gabapentin will continue at 300mg  po TID. 6. Continue the HCTZ 25mg  po each day for hypertension. 7. Continue Seroquel XR 800 mg at hs. 8. Will start Keflex 500 QID for folliculitis infection to the skin. 9. Continue routine PRN meds for symptoms. 10. CM will work on treatment facility for recent relapse on alcohol and cocaine at patient's request. 11. Continue to monitor.  Rona Ravens. Orli Degrave PAC

## 2011-12-05 DIAGNOSIS — F6089 Other specific personality disorders: Secondary | ICD-10-CM

## 2011-12-05 DIAGNOSIS — F10939 Alcohol use, unspecified with withdrawal, unspecified: Secondary | ICD-10-CM

## 2011-12-05 DIAGNOSIS — F10239 Alcohol dependence with withdrawal, unspecified: Secondary | ICD-10-CM

## 2011-12-05 DIAGNOSIS — F1020 Alcohol dependence, uncomplicated: Secondary | ICD-10-CM

## 2011-12-05 DIAGNOSIS — F332 Major depressive disorder, recurrent severe without psychotic features: Principal | ICD-10-CM

## 2011-12-05 DIAGNOSIS — F339 Major depressive disorder, recurrent, unspecified: Secondary | ICD-10-CM | POA: Diagnosis present

## 2011-12-05 MED ORDER — CEPHALEXIN 500 MG PO CAPS
500.0000 mg | ORAL_CAPSULE | Freq: Three times a day (TID) | ORAL | Status: AC
  Administered 2011-12-05 – 2011-12-14 (×36): 500 mg via ORAL
  Filled 2011-12-05 (×37): qty 1

## 2011-12-05 MED ORDER — GABAPENTIN 400 MG PO CAPS
400.0000 mg | ORAL_CAPSULE | ORAL | Status: DC
  Administered 2011-12-05 – 2011-12-27 (×65): 400 mg via ORAL
  Filled 2011-12-05 (×3): qty 1
  Filled 2011-12-05: qty 42
  Filled 2011-12-05: qty 1
  Filled 2011-12-05: qty 42
  Filled 2011-12-05 (×29): qty 1
  Filled 2011-12-05: qty 42
  Filled 2011-12-05 (×36): qty 1

## 2011-12-05 MED ORDER — TRAZODONE HCL 100 MG PO TABS
100.0000 mg | ORAL_TABLET | Freq: Every evening | ORAL | Status: DC | PRN
  Administered 2011-12-06: 100 mg via ORAL
  Filled 2011-12-05: qty 1

## 2011-12-05 MED ORDER — SERTRALINE HCL 50 MG PO TABS
50.0000 mg | ORAL_TABLET | Freq: Every day | ORAL | Status: DC
  Administered 2011-12-05 – 2011-12-07 (×3): 50 mg via ORAL
  Filled 2011-12-05 (×5): qty 1

## 2011-12-05 NOTE — Progress Notes (Signed)
[facility] Group Notes:  (Counselor/Nursing/MHT/Case Management/Adjunct)  12/05/2011 3:55 PM  Type of Therapy:  Group Therapy  Participation Level:  Did Not Attend    Lacey Moses 12/05/2011, 3:55 PM

## 2011-12-05 NOTE — Discharge Planning (Signed)
Met with patient several times in her room to discuss various residential treatment options as more became known through continuing phone calls.  Patient has a newer insurance that Case Manager is not as familiar with, so called the benefits department to clarify.  Essentially, patient has already paid her deductible for the year and thus her treatment will be covered at 100% for both in-network and out-of-network places.  Because this is like a state program, any facility in [place] which accepts her agrees to the [employer] fee schedule, and agrees not to bill her any difference in the normal rate and the insurance payment.  Out-of-state facilities are not bound by that agreement and could choose to bill the patient the difference between the [facility] pay and their rates.  Case Manager called a variety of programs such as [facility] and [facility], and found that they cannot take this patient's insurance at all due to exclusionary clauses in their contracts.  Case Manager found that patient's first choice, [facility] in [place], does not take any insurance and so she can only go there if she can pay individually.  It appears that if she could do this, her insurance company would reimburse her the Medicare rate.  A brochure has been given to her to consider this.  [facility] is another possibility and they were encouraging re patient's insurance, yet also said that it is hard to get a female bed there.  They asked Case Manager to call back as soon as detox is completed.  Patient originally said she does not want to go to [facility] because it is only 14 days; however, Case Manager has asked her to reconsider since they do take her insurance.  Last known possibility at this time is [facility], which is a 30-day program.  However, patient is not sure if she could get there.  She is going to call her pastor, she said, to see if this could be arranged.  When patient makes a  decision about her first choice, Case Manager will start trying to arrange transfer for the point of discharge.  Ambrose Mantle, LCSW 12/05/2011, 3:29 PM
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
## 2011-12-05 NOTE — Progress Notes (Signed)
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  Alcohol Dependence. Cocaine Abuse. Cannabis Abuse.  The patient was seen today and reports the following:   ADL's: Intact.  Sleep: The patient reports to having difficulty initiating and maintaining sleep. Appetite: The patient reports a good appetite today.   Mild>(1-10) >Severe  Hopelessness (1-10): 9  Depression (1-10): 9  Anxiety (1-10): 9   Suicidal Ideation: The patient adamantly denies any suicidal ideations today.  Plan: No  Intent: No  Means: No   Homicidal Ideation: The patient adamantly denies any homicidal ideations today.  Plan: No  Intent: No.  Means: No   General Appearance/Behavior: The patient remains cooperative today with this provider but is angry today with another patient on the unit.  Eye Contact: Good.  Speech: Appropriate in rate and volume with no pressuring noted today.  Motor Behavior: wnl.  Level of Consciousness: Alert and Oriented x 3.  Mental Status: Alert and Oriented x 3.  Mood: Severely depressed today.  Affect: Severely constricted.  Anxiety Level: Severe anxiety reported today.  Thought Process: wnl.  Thought Content: The patient denies any auditory or visual hallucinations today as well as any delusional thinking.  Perception:. wnl.  Judgment: Fair.  Insight: Fair.  Cognition: Oriented to person, place and time.  Sleep:  Number of Hours: 6.5    Vital Signs:Blood pressure 117/83, pulse 105, temperature 98.4 F (36.9 C), temperature source Oral, resp. rate 17, height 5\' 4"  (1.626 m), weight 78.019 kg (172 lb).  Current Medications: Current Facility-Administered Medications  Medication Dose Route Frequency Provider Last Rate Last Dose  . acetaminophen (TYLENOL) tablet 650 mg  650 mg Oral Q6H PRN Nehemiah Settle, MD      . alum & mag hydroxide-simeth (MAALOX/MYLANTA) 200-200-20 MG/5ML suspension 30 mL  30 mL  Oral Q4H PRN Nehemiah Settle, MD      . cephALEXin (KEFLEX) capsule 500 mg  500 mg Oral TID WC & HS Curlene Labrum Terre Hanneman, MD      . chlordiazePOXIDE (LIBRIUM) capsule 25 mg  25 mg Oral QID Alyson Kuroski-Mazzei, DO   25 mg at 12/04/11 2146   Followed by  . chlordiazePOXIDE (LIBRIUM) capsule 25 mg  25 mg Oral TID Alyson Kuroski-Mazzei, DO   25 mg at 12/05/11 1303   Followed by  . chlordiazePOXIDE (LIBRIUM) capsule 25 mg  25 mg Oral BH-qamhs Alyson Kuroski-Mazzei, DO       Followed by  . chlordiazePOXIDE (LIBRIUM) capsule 25 mg  25 mg Oral Daily Alyson Kuroski-Mazzei, DO      . diphenhydrAMINE (BENADRYL) capsule 50 mg  50 mg Oral Q6H PRN Verne Spurr, PA-C   50 mg at 12/05/11 1609  . efavirenz-emtrictabine-tenofovir (ATRIPLA) 600-200-300 MG per tablet 1 tablet  1 tablet Oral QHS Ronny Bacon, MD   1 tablet at 12/04/11 2145  . fluocinonide (LIDEX) 0.05 % external solution 1 application  1 application Topical QPM Verne Spurr, PA-C   1 application at 12/04/11 2100  . gabapentin (NEURONTIN) capsule 400 mg  400 mg Oral [id number] Shequita Peplinski D Tallyn Holroyd, MD      . hydrochlorothiazide (HYDRODIURIL) tablet 25 mg  25 mg Oral Daily Curlene Labrum Layne Lebon, MD   25 mg at 12/05/11 0824  . magnesium hydroxide (MILK OF MAGNESIA) suspension 30 mL  30 mL Oral Daily PRN Nehemiah Settle, MD      . metroNIDAZOLE (FLAGYL) tablet 500 mg  500 mg Oral Q12H Curlene Labrum Delta Pichon, MD   500 mg at 12/05/11 0827  . mulitivitamin with minerals tablet 1 tablet  1 tablet Oral Daily Ronny Bacon, MD   1 tablet at 12/05/11 0824  . ondansetron (ZOFRAN-ODT) disintegrating tablet 4 mg  4 mg Oral Q8H PRN Nehemiah Settle, MD   4 mg at 12/04/11 1009  . QUEtiapine (SEROQUEL XR) 24 hr tablet 800 mg  800 mg Oral QHS Verne Spurr, PA-C   800 mg at 12/04/11 2145  . sertraline (ZOLOFT) tablet 50 mg  50 mg Oral Daily Curlene Labrum Brittain Hosie, MD      . simvastatin (ZOCOR) tablet 10 mg  10 mg Oral q1800 Verne Spurr, PA-C   10 mg  at 12/04/11 1800  . thiamine (VITAMIN B-1) tablet 100 mg  100 mg Oral Daily Alyson Kuroski-Mazzei, DO   100 mg at 12/05/11 0823  . tretinoin (RETIN-A) 0.025 % cream 1 application  1 application Topical QHS Verne Spurr, PA-C   1 application at 12/04/11 2200  . valACYclovir (VALTREX) tablet 1,000 mg  1,000 mg Oral Daily Verne Spurr, PA-C   1,000 mg at 12/05/11 0824  . DISCONTD: cephALEXin (KEFLEX) capsule 500 mg  500 mg Oral TID WC & HS Verne Spurr, PA-C   500 mg at 12/05/11 1302  . DISCONTD: gabapentin (NEURONTIN) capsule 300 mg  300 mg Oral TID Verne Spurr, PA-C   300 mg at 12/05/11 1304  . DISCONTD: risperiDONE (RISPERDAL) tablet 1 mg  1 mg Oral QHS Nehemiah Settle, MD   1 mg at 12/04/11 2146   Lab Results: No results found for this or any previous visit (from the past 48 hour(s)).  Review of Systems:  Neurological: The patient denies any headaches today. She denies any seizures or dizziness.  G.I.: The patient denies any constipation or G.I. Upset today.  Musculoskeletal: The patient denies any muscle or skeletal difficulties.   Time was spent today discussing with the patient her current symptoms. The patient reports to having significant difficulty initiating and maintaining sleep and reports a good appetite.  The patient reports severe feelings of sadness, anhedonia and depressed mood as well as severe anxiety but denies any suicidal or homicidal ideations.  She denies any auditory or visual hallucinations or delusional thinking.    Time was spent today allowing the patient to discuss her frustrations with another patient on the unit and supportive therapy was given.  Treatment Plan Summary:  1. Daily contact with patient to assess and evaluate symptoms and progress in treatment.  2. Medication management  3. The patient will deny suicidal ideations or homicidal ideations for 48 hours prior to discharge and have a depression and anxiety rating of 3 or less. The patient  will also deny any auditory or visual hallucinations or delusional thinking.  4. The patient will deny any symptoms of substance withdrawal at time of discharge.   Plan:  1. Will start the medication Zoloft at 50 mgs po q am for depression and anxiety. 2. Will increase the medication Neurontin to 400 mgs po q am, 2 pm and hs for anxiety and mood stabilization. 3. Will continue the medication Seroquel XR 800 mgs po qhs for sleep and mood stabilization. 4. Will start the medication Trazodone 100 mgs po qhs - prn for sleep. 5. Will continue the patient on her Librium detox protocol. 6. Will continue the patient on her non-psychiatric medications.  7. Laboratory studies reviewed.  8. Will continue to  monitor.   Allon Costlow 12/05/2011, 4:23 PM

## 2011-12-05 NOTE — Progress Notes (Signed)
Patient ID: Lacey Moses, female   DOB: Jul 11, 1965, 46 y.o.   MRN: [id number]   Patient pleasant on approach today. States that is depressed "2" on scale and "10" on hopelessness scale. Currently denies any thought of self harm or a/v hallucinations. Withdrawals- Says feels nauseated at times with slight tremors and agitation. Staff will monitor and encourage group attendance.

## 2011-12-05 NOTE — Progress Notes (Signed)
In hallway on approach. Appers flat and blunted but did smile and joke at intervals. Calm and cooperative during assessment. No acute distress. Stated she had had a good day and then went on to discuss the behavior of the pts and the problems she was having. Reoriented pt to her and setting and encouraged patience with her peers. Encouraged to talk with staff when she has a dispute with her peers and allow them to intervene. Also continues to have questions about her medications and times they are do. Writer reinforced her orders and when she gets them. Verbalized understanding. She had no additional questions or concerns. Denies pain. Denies SI/HI/AVH and contracts for safety. Safety has been maintained with Q15 minute observation. Will continue current POC.

## 2011-12-06 NOTE — Progress Notes (Signed)
[facility] MD Progress Note  12/06/2011 4:42 PM  Diagnosis:  Axis I: Major Depressive Disorder - Recurrent - Severe.  Alcohol Dependence.  Cocaine Abuse.  Cannabis Abuse.   The patient was seen today and reports the following:   ADL's: Intact.  Sleep: The patient reports to sleeping well last night but feels oversedated this morning.  Appetite: The patient reports a good appetite today.   Mild>(1-10) >Severe  Hopelessness (1-10): 5  Depression (1-10): 5  Anxiety (1-10): 5   Suicidal Ideation: The patient adamantly denies any suicidal ideations today.  Plan: No  Intent: No  Means: No   Homicidal Ideation: The patient adamantly denies any homicidal ideations today.  Plan: No  Intent: No.  Means: No   General Appearance/Behavior: The patient remains cooperative today with this provider.  Eye Contact: Good.  Speech: Appropriate in rate and volume with no pressuring noted today.  Motor Behavior: wnl.  Level of Consciousness: Alert and Oriented x 3.  Mental Status: Alert and Oriented x 3.  Mood: Moderately depressed today.  Affect: Moderately constricted.  Anxiety Level: Moderate anxiety reported today.  Thought Process: wnl.  Thought Content: The patient denies any auditory or visual hallucinations today as well as any delusional thinking.  Perception:. wnl.  Judgment: Fair.  Insight: Fair.  Cognition: Oriented to person, place and time.  Sleep:  Number of Hours: 6.75    Vital Signs:Blood pressure 113/75, pulse 101, temperature 97.4 F (36.3 C), temperature source Oral, resp. rate 18, height 5\' 4"  (1.626 m), weight 78.019 kg (172 lb).  Current Medications: Current Facility-Administered Medications  Medication Dose Route Frequency Provider Last Rate Last Dose  . acetaminophen (TYLENOL) tablet 650 mg  650 mg Oral Q6H PRN Nehemiah Settle, MD      . alum & mag hydroxide-simeth (MAALOX/MYLANTA) 200-200-20 MG/5ML suspension 30 mL  30 mL Oral Q4H PRN Nehemiah Settle, MD      . cephALEXin (KEFLEX) capsule 500 mg  500 mg Oral TID WC & HS Curlene Labrum Salif Tay, MD   500 mg at 12/06/11 1305  . chlordiazePOXIDE (LIBRIUM) capsule 25 mg  25 mg Oral TID Alyson Kuroski-Mazzei, DO   25 mg at 12/05/11 1718   Followed by  . chlordiazePOXIDE (LIBRIUM) capsule 25 mg  25 mg Oral BH-qamhs Alyson Kuroski-Mazzei, DO   25 mg at 12/06/11 0940   Followed by  . chlordiazePOXIDE (LIBRIUM) capsule 25 mg  25 mg Oral Daily Alyson Kuroski-Mazzei, DO      . diphenhydrAMINE (BENADRYL) capsule 50 mg  50 mg Oral Q6H PRN Verne Spurr, PA-C   50 mg at 12/05/11 1609  . efavirenz-emtrictabine-tenofovir (ATRIPLA) 600-200-300 MG per tablet 1 tablet  1 tablet Oral QHS Ronny Bacon, MD   1 tablet at 12/05/11 2137  . fluocinonide (LIDEX) 0.05 % external solution 1 application  1 application Topical QPM Verne Spurr, PA-C   1 application at 12/05/11 1700  . gabapentin (NEURONTIN) capsule 400 mg  400 mg Oral [id number] Curlene Labrum Kinslee Dalpe, MD   400 mg at 12/06/11 1348  . hydrochlorothiazide (HYDRODIURIL) tablet 25 mg  25 mg Oral Daily Curlene Labrum Tyrion Glaude, MD   25 mg at 12/06/11 0940  . magnesium hydroxide (MILK OF MAGNESIA) suspension 30 mL  30 mL Oral Daily PRN Nehemiah Settle, MD      . metroNIDAZOLE (FLAGYL) tablet 500 mg  500 mg Oral Q12H Curlene Labrum Analilia Geddis, MD   500 mg at 12/06/11 0939  . mulitivitamin with minerals  tablet 1 tablet  1 tablet Oral Daily Ronny Bacon, MD   1 tablet at 12/06/11 0940  . ondansetron (ZOFRAN-ODT) disintegrating tablet 4 mg  4 mg Oral Q8H PRN Nehemiah Settle, MD   4 mg at 12/04/11 1009  . QUEtiapine (SEROQUEL XR) 24 hr tablet 800 mg  800 mg Oral QHS Verne Spurr, PA-C   800 mg at 12/05/11 2138  . sertraline (ZOLOFT) tablet 50 mg  50 mg Oral Daily Curlene Labrum Abri Vacca, MD   50 mg at 12/06/11 0940  . simvastatin (ZOCOR) tablet 10 mg  10 mg Oral q1800 Verne Spurr, PA-C   10 mg at 12/05/11 1718  . thiamine (VITAMIN B-1) tablet 100 mg  100 mg  Oral Daily Alyson Kuroski-Mazzei, DO   100 mg at 12/06/11 0940  . traZODone (DESYREL) tablet 100 mg  100 mg Oral QHS PRN Curlene Labrum Gaynelle Pastrana, MD      . tretinoin (RETIN-A) 0.025 % cream 1 application  1 application Topical QHS Verne Spurr, PA-C   1 application at 12/05/11 2200  . valACYclovir (VALTREX) tablet 1,000 mg  1,000 mg Oral Daily Verne Spurr, PA-C   1,000 mg at 12/06/11 0940   Lab Results: No results found for this or any previous visit (from the past 48 hour(s)).  Review of Systems:  Neurological: The patient denies any headaches today. She denies any seizures or dizziness.  G.I.: The patient denies any constipation or G.I. Upset today.  Musculoskeletal: The patient denies any muscle or skeletal difficulties.   Time was spent today discussing with the patient her current symptoms. The patient reports to sleeping well last night but feels somewhat oversedated this morning.  She reports a good appetite but reports moderate feelings of sadness, anhedonia and depressed mood as well as moderate anxiety but denies any suicidal or homicidal ideations. She denies any auditory or visual hallucinations or delusional thinking today.   Treatment Plan Summary:  1. Daily contact with patient to assess and evaluate symptoms and progress in treatment.  2. Medication management  3. The patient will deny suicidal ideations or homicidal ideations for 48 hours prior to discharge and have a depression and anxiety rating of 3 or less. The patient will also deny any auditory or visual hallucinations or delusional thinking.  4. The patient will deny any symptoms of substance withdrawal at time of discharge.   Plan:  1. Will continue the medication Zoloft at 50 mgs po q am for depression and anxiety.  2. Will continue the medication Neurontin to 400 mgs po q am, 2 pm and hs for anxiety and mood stabilization.  3. Will continue the medication Seroquel XR 800 mgs po qhs for sleep and mood stabilization.  4.  Will continue the medication Trazodone 100 mgs po qhs - prn for sleep.  5. Will continue the patient on her Librium detox protocol.  6. Will continue the patient on her non-psychiatric medications.  7. Laboratory studies reviewed.  8. Will continue to monitor.   Jhoanna Heyde 12/06/2011, 4:42 PM

## 2011-12-06 NOTE — Discharge Planning (Signed)
Did not meet with patient today other than briefly in the hall, as she was not in Aftercare Planning Group and she was irritable each time seen today.  Case Manager has given her several options re rehab facility, namely [facility] or [facility], but she is unlikely to discharge prior to Monday.    Case Manager did utilization review requesting additional days.  Ambrose Mantle, LCSW 12/06/2011, 3:23 PM

## 2011-12-06 NOTE — Progress Notes (Signed)
Pt is irritable on approach and requested all the medications she has ordered around 2030  Her thinking is logical and coherent however she forgets what she has asked previously and asks again  she interacts minimally and does not attend group   She requested a medication list   She reports no signs or symptoms of withdrawal   She does not appear to respond to internal stimuli and denies A/V hallucinations and voices  She does seem to be preoccupied  Verbal support given  Medications administered and effectiveness monitored  Q 15 min checks   medication list provided for pt   Pt safe at present

## 2011-12-06 NOTE — Progress Notes (Signed)
Patient visible on the unit. She is not attending groups. Denies SI/HI. Patient continues to ask questions that have been answered previously by previous staff. The patient tends to get irritable with the given answer. However, she has become significantly calmer since the time of her admission. Patient talked with Clinical research associate about her struggles with being homeless for the last six months. Patient reported becoming homeless after a breakup with a fiance who was cheating. Patient has a tentative discharge date of Monday. She will likely go to [facility] or [facility]. Remains safe on the unit.

## 2011-12-07 MED ORDER — SERTRALINE HCL 100 MG PO TABS
100.0000 mg | ORAL_TABLET | Freq: Every day | ORAL | Status: DC
  Administered 2011-12-08 – 2011-12-11 (×4): 100 mg via ORAL
  Filled 2011-12-07 (×6): qty 1

## 2011-12-07 MED ORDER — SALINE SPRAY 0.65 % NA SOLN
1.0000 | NASAL | Status: DC | PRN
  Administered 2011-12-07 – 2011-12-16 (×5): 1 via NASAL
  Filled 2011-12-07 (×3): qty 44

## 2011-12-07 NOTE — Tx Team (Signed)
Interdisciplinary Treatment Plan Update (Adult)  Date:  12/07/2011  Time Reviewed:  10:15AM-11:15AM  Progress in Treatment: Attending groups:  No, refuses to attend groups at all, says they trigger her Participating in groups:    No Taking medication as prescribed:    Yes Tolerating medication:   Yes Family/Significant other contact made:  Yes, with brother (counselor called) Patient understands diagnosis:   Yes, poor insight, poor judgment Discussing patient identified problems/goals with staff:   Yes Medical problems stabilized or resolved:   Yes Denies suicidal/homicidal ideation:  No, states that if we discharge her from here without a plan in place for a long-term treatment (2 years) program, she will run in front of a train Issues/concerns per patient self-inventory:   None Other:    New problem(s) identified: Yes, Describe:  Patient is refusing to attend groups, and states she does not want to spend time around "those drug addicts" and shows no insight into the contradiction this is with her desire to go into a program  Reason for Continuation of Hospitalization: Anxiety Depression Suicidal ideation Other; describe placement into long-term rehab is desired/needed  Interventions implemented related to continuation of hospitalization:  Medication monitoring and adjustment, safety checks Q15 min., suicide risk assessment, group therapy, psychoeducation, collateral contact, aftercare planning, ongoing physician assessments, medication education  Additional comments:  Not applicable  Estimated length of stay:  3-4 days  Discharge Plan:  This is being worked on, as patient had previously requested that Sports coach for a rehab program but today states that she wants a 2-year program.  New goal(s):  Not applicable  Review of initial/current patient goals per problem list:   1.  Goal(s):  Deny SI for 48 hours prior to D/C.  Met:  No  Target date:  By Discharge   As evidenced  by:  Actively suicidal if discharged without being sent to treatment  2.  Goal(s):  Decide if & how to address substance abuse issues at discharge.  Met:  No  Target date:  By Discharge   As evidenced by:  Ongoing search  3.  Goal(s):  Reduce auditory and visual hallucinations to baseline per patient and collateral report.  Met:  Yes  Target date:  By Discharge   As evidenced by:  Denies  4.  Goal(s):  Reduce depression to no greater than 3 at discharge.  Met:  No  Target date:  By Discharge   As evidenced by: "10" today    5. Goal(s): Successfully complete detox from alcohol.  Met: Yes Target date: By Discharge  As evidenced by: Finished today  Attendees: Patient:  Lacey Moses  12/07/2011 11:00AM  Family:     Physician:  Dr. Harvie Heck Readling 12/07/2011 10:15AM-11:15AM  Nursing:   Tacy Learn, RN 12/07/2011 10:15AM -11:15AM   Case Manager:  Ambrose Mantle, LCSW 12/07/2011 10:15AM-11:15AM  Counselor:  Veto Kemps, MT-BC 12/07/2011 10:15AM-11:15AM  Other:   Shelda Jakes, RN 12/07/2011 10:15AM-11:15AM  Other:      Other:      Other:       Scribe for Treatment Team:   Sarina Ser, 12/07/2011, 10:15AM-11:15AM

## 2011-12-07 NOTE — Progress Notes (Signed)
[facility] MD Progress Note  12/07/2011 3:55 PM  Diagnosis:  Axis I: Major Depressive Disorder - Recurrent - Severe.  Alcohol Dependence.  Cocaine Abuse.  Cannabis Abuse.   The patient was seen today and reports the following:   ADL's: Intact.  Sleep: The patient reports to having difficulty sleeping last night.  Appetite: The patient reports a good appetite today.   Mild>(1-10) >Severe  Hopelessness (1-10): 10  Depression (1-10): 10  Anxiety (1-10): 9   Suicidal Ideation: The patient denies any suicidal ideations today.  Plan: No  Intent: No  Means: No   Homicidal Ideation: The patient denies any homicidal ideations today.  Plan: No  Intent: No.  Means: No   General Appearance/Behavior: The patient remains cooperative today with this provider.  Eye Contact: Good.  Speech: Appropriate in rate and volume with no pressuring noted today.  Motor Behavior: wnl.  Level of Consciousness: Alert and Oriented x 3.  Mental Status: Alert and Oriented x 3.  Mood: Severely depressed today.  Affect: Severely constricted.  Anxiety Level: Severe anxiety reported today.  Thought Process: wnl.  Thought Content: The patient denies any auditory or visual hallucinations today as well as any delusional thinking.  Perception:. wnl.  Judgment: Fair.  Insight: Fair.  Cognition: Oriented to person, place and time.  Sleep:  Number of Hours: 1.5    Vital Signs:Blood pressure 127/85, pulse 106, temperature 98.2 F (36.8 C), temperature source Oral, resp. rate 18, height 5\' 4"  (1.626 m), weight 78.019 kg (172 lb).  Current Medications: Current Facility-Administered Medications  Medication Dose Route Frequency Provider Last Rate Last Dose  . acetaminophen (TYLENOL) tablet 650 mg  650 mg Oral Q6H PRN Nehemiah Settle, MD      . alum & mag hydroxide-simeth (MAALOX/MYLANTA) 200-200-20 MG/5ML suspension 30 mL  30 mL Oral Q4H PRN Nehemiah Settle, MD      . cephALEXin (KEFLEX) capsule  500 mg  500 mg Oral TID WC & HS Curlene Labrum Avalon Coppinger, MD   500 mg at 12/07/11 0845  . chlordiazePOXIDE (LIBRIUM) capsule 25 mg  25 mg Oral BH-qamhs Alyson Kuroski-Mazzei, DO   25 mg at 12/06/11 2036   Followed by  . chlordiazePOXIDE (LIBRIUM) capsule 25 mg  25 mg Oral Daily Alyson Kuroski-Mazzei, DO   25 mg at 12/07/11 0846  . diphenhydrAMINE (BENADRYL) capsule 50 mg  50 mg Oral Q6H PRN Verne Spurr, PA-C   50 mg at 12/06/11 2121  . efavirenz-emtrictabine-tenofovir (ATRIPLA) 600-200-300 MG per tablet 1 tablet  1 tablet Oral QHS Ronny Bacon, MD   1 tablet at 12/06/11 2036  . fluocinonide (LIDEX) 0.05 % external solution 1 application  1 application Topical QPM Verne Spurr, PA-C   1 application at 12/06/11 1718  . gabapentin (NEURONTIN) capsule 400 mg  400 mg Oral [id number] Curlene Labrum Karris Deangelo, MD   400 mg at 12/07/11 0846  . hydrochlorothiazide (HYDRODIURIL) tablet 25 mg  25 mg Oral Daily Curlene Labrum Dorrine Montone, MD   25 mg at 12/07/11 0845  . magnesium hydroxide (MILK OF MAGNESIA) suspension 30 mL  30 mL Oral Daily PRN Nehemiah Settle, MD      . mulitivitamin with minerals tablet 1 tablet  1 tablet Oral Daily Ronny Bacon, MD   1 tablet at 12/07/11 0846  . ondansetron (ZOFRAN-ODT) disintegrating tablet 4 mg  4 mg Oral Q8H PRN Nehemiah Settle, MD   4 mg at 12/06/11 2340  . QUEtiapine (SEROQUEL XR) 24 hr tablet  800 mg  800 mg Oral QHS Verne Spurr, PA-C   800 mg at 12/06/11 2035  . sertraline (ZOLOFT) tablet 100 mg  100 mg Oral Daily Curlene Labrum Erland Vivas, MD      . simvastatin (ZOCOR) tablet 10 mg  10 mg Oral q1800 Verne Spurr, PA-C   10 mg at 12/06/11 1716  . sodium chloride (OCEAN) 0.65 % nasal spray 1 spray  1 spray Each Nare PRN Curlene Labrum Alicia Ackert, MD      . thiamine (VITAMIN B-1) tablet 100 mg  100 mg Oral Daily Alyson Kuroski-Mazzei, DO   100 mg at 12/07/11 0845  . traZODone (DESYREL) tablet 100 mg  100 mg Oral QHS PRN Ronny Bacon, MD   100 mg at 12/06/11 2036  . tretinoin  (RETIN-A) 0.025 % cream 1 application  1 application Topical QHS Verne Spurr, PA-C   1 application at 12/06/11 2038  . valACYclovir (VALTREX) tablet 1,000 mg  1,000 mg Oral Daily Verne Spurr, PA-C   1,000 mg at 12/07/11 0845  . DISCONTD: sertraline (ZOLOFT) tablet 50 mg  50 mg Oral Daily Curlene Labrum Charlis Harner, MD   50 mg at 12/07/11 0845   Lab Results: No results found for this or any previous visit (from the past 48 hour(s)).  Review of Systems:  Neurological: The patient denies any headaches today. She denies any seizures or dizziness.  G.I.: The patient denies any constipation or G.I. Upset today.  Musculoskeletal: The patient denies any muscle or skeletal difficulties.   Time was spent today discussing with the patient her current symptoms. The patient reports to having difficulty initiating and maintaining sleep last night.  She reports a good appetite but reports severe feelings of sadness, anhedonia and depressed mood as well as severe anxiety but denies any suicidal or homicidal ideations but states if discharged she would "walk in front of a car or train" due to feelings hopeless about her situation.. She denies any auditory or visual hallucinations or delusional thinking today.  The patient does report some nasal dryness and nose bleeds.   Treatment Plan Summary:  1. Daily contact with patient to assess and evaluate symptoms and progress in treatment.  2. Medication management  3. The patient will deny suicidal ideations or homicidal ideations for 48 hours prior to discharge and have a depression and anxiety rating of 3 or less. The patient will also deny any auditory or visual hallucinations or delusional thinking.  4. The patient will deny any symptoms of substance withdrawal at time of discharge.   Plan:  1. Will increase the medication Zoloft to 100 mgs po q am for depression and anxiety.  2. Will continue the medication Neurontin to 400 mgs po q am, 2 pm and hs for anxiety and  mood stabilization.  3. Will continue the medication Seroquel XR 800 mgs po qhs for sleep and mood stabilization.  4. Will continue the medication Trazodone 100 mgs po qhs - prn for sleep.  5. Will continue the patient on her Librium detox protocol.  6. Will continue the patient on her non-psychiatric medications.  7. Will start a Saline Nasal Spray - prn for nasal dryness and bleeding. 8. Laboratory studies reviewed.  9. Will continue to monitor.   Julianna Vanwagner 12/07/2011, 3:55 PM

## 2011-12-07 NOTE — Discharge Planning (Signed)
Patient was seen in Treatment Team (see update, this date), as well as individually several times throughout the day.  Patient complied today with Treatment Team directive to go to groups on the 300 hall, having not attended groups on 400 or on 300 previously.  She complained to Case Manager that half of the people in the 300 hall group were talking about using as soon as possible, and that could be a trigger for her.  She acknowledged that the personality of the group can change with the addition or subtraction of just one person, and Case Manager asked her to keep attending, keep trying, encouraged her to be a leader stating how important her sobriety is to her.  Case Manager provided patient with information about a long-term program in [facility] [place] called [employer].  She is supposed to look at this over the weekend, complete the application and write a 3-page autobiography in order to apply to that program, if she wishes to pursue this.    Patient signed a consent for Case Manager to talk to her Public Defender Lanae Boast, [phone], to tell re patient being in treatment and seeking to go to long-term treatment following this hospitalization.  This was done, awaiting call back.  No other case management needs today.  Ambrose Mantle, LCSW 12/07/2011, 3:14 PM

## 2011-12-07 NOTE — Progress Notes (Signed)
Patient ID: Lacey Moses, female   DOB: [birth year], 46 y.o.   MRN: [id number] Pt pleasant with staff during assessment, but continues to endorse depression. Pt compliant with medications, denies SI or plans to harm herself. No s/s of distress noted at this time.

## 2011-12-07 NOTE — Progress Notes (Signed)
[facility] Group Notes:  (Counselor/Nursing/MHT/Case Management/Adjunct)  12/07/2011 8:02 PM  Type of Therapy:  Group Therapy at 1:15  Participation Level:  Active  Participation Quality:  Attentive and Sharing  Affect:  Irritable yet able to engage  Cognitive:  Alert  Insight:  Good  Engagement in Group:  Good  Engagement in Therapy:  Good  Modes of Intervention:  Clarification, Role-play, Socialization and Support  Summary of Progress/Problems: Jaedan  shared that "if I was to wake up tomorrow and find that a miracle had occurred and things were better a sign that the miracle had occurred would be that my Mom is able to have some peace; peace from not having to worry about me.  She has had to go to some places no mom should have to go to to rescue me."  Natahsa was willing to role play with Clinical research associate how to deal others mistrust.  Chrysta shared a laugh at several points.    Clide Dales 12/07/2011, 8:02 PM

## 2011-12-08 MED ORDER — BISACODYL 5 MG PO TBEC
5.0000 mg | DELAYED_RELEASE_TABLET | Freq: Every day | ORAL | Status: DC | PRN
  Administered 2011-12-08 – 2011-12-11 (×2): 5 mg via ORAL
  Filled 2011-12-08 (×2): qty 1

## 2011-12-08 MED ORDER — BISACODYL 10 MG RE SUPP
10.0000 mg | Freq: Once | RECTAL | Status: AC
  Administered 2011-12-08: 10 mg via RECTAL
  Filled 2011-12-08 (×2): qty 1

## 2011-12-08 NOTE — Progress Notes (Signed)
Pt has been very needy and somatic today   She has complained about constipation and abdominal cramps from her period   Pt has been given medications and warm packs  She is cooperative but does not attend groups   Pt endorses depression although she is not suicidal at present  She denies suicidal and homicidal ideations and auditory and visual hallucinations  Verbal support given  medications administered and effectiveness monitored  Q 15 min checks  Pt safe at present

## 2011-12-08 NOTE — Progress Notes (Signed)
Patient ID: Lacey Moses, female   DOB: June 21, 1966, 46 y.o.   MRN: [id number]  [facility] Group Notes:  (Counselor/Nursing/MHT/Case Management/Adjunct)  12/08/2011 11 AM  Type of Therapy:  Aftercare Planning, Group Therapy, Dance/Movement Therapy   Participation Level:  Did Not Attend   Rhunette Croft

## 2011-12-08 NOTE — Progress Notes (Signed)
Patient ID: Lacey Moses, female   DOB: Jul 11, 1965, 46 y.o.   MRN: [id number] [facility] MD Progress Note  12/08/2011 8:14 PM  Diagnosis:  Axis I: Major Depressive Disorder - Recurrent - Severe.  Alcohol Dependence.  Cocaine Abuse.  Cannabis Abuse.   The patient was seen today and reports the following:   ADL's: Intact.  Sleep: The patient reports to having difficulty sleeping last night.  Appetite: The patient reports a good appetite today.   Mild>(1-10) >Severe  Hopelessness (1-10): 8 Depression (1-10): 9 Anxiety (1-10): 9   Suicidal Ideation: The patient denies any suicidal ideations today.  Plan: No  Intent: No  Means: No   Homicidal Ideation: The patient denies any homicidal ideations today.  Plan: No  Intent: No.  Means: No   General Appearance/Behavior: The patient remains cooperative today with this provider.  Eye Contact: Good.  Speech: Appropriate in rate and volume with no pressuring noted today.  Motor Behavior: wnl.  Level of Consciousness: Alert and Oriented x 3.  Mental Status: Alert and Oriented x 3.  Mood: Severely depressed today.  Affect: Severely constricted.  Anxiety Level: Severe anxiety reported today.  Thought Process: wnl.  Thought Content: AVH, no si Perception:. wnl.  Judgment: Fair.  Insight: Fair.  Cognition: Oriented to person, place and time.  Sleep:  Number of Hours: 6.5    Vital Signs:Blood pressure 129/75, pulse 101, temperature 98.3 F (36.8 C), temperature source Oral, resp. rate 16, height 5\' 4"  (1.626 m), weight 78.019 kg (172 lb).  Current Medications: Current Facility-Administered Medications  Medication Dose Route Frequency Provider Last Rate Last Dose  . acetaminophen (TYLENOL) tablet 650 mg  650 mg Oral Q6H PRN Nehemiah Settle, MD   650 mg at 12/08/11 1707  . alum & mag hydroxide-simeth (MAALOX/MYLANTA) 200-200-20 MG/5ML suspension 30 mL  30 mL Oral Q4H PRN Nehemiah Settle, MD      . bisacodyl (DULCOLAX) EC  tablet 5 mg  5 mg Oral Daily PRN Mickie D. Adams, PA   5 mg at 12/08/11 0931  . bisacodyl (DULCOLAX) suppository 10 mg  10 mg Rectal Once Mickie D. Adams, PA   10 mg at 12/08/11 0930  . cephALEXin (KEFLEX) capsule 500 mg  500 mg Oral TID WC & HS Curlene Labrum Readling, MD   500 mg at 12/08/11 1707  . diphenhydrAMINE (BENADRYL) capsule 50 mg  50 mg Oral Q6H PRN Verne Spurr, PA-C   50 mg at 12/07/11 1557  . efavirenz-emtrictabine-tenofovir (ATRIPLA) 600-200-300 MG per tablet 1 tablet  1 tablet Oral QHS Ronny Bacon, MD   1 tablet at 12/07/11 2108  . fluocinonide (LIDEX) 0.05 % external solution 1 application  1 application Topical QPM Verne Spurr, PA-C   1 application at 12/08/11 1708  . gabapentin (NEURONTIN) capsule 400 mg  400 mg Oral [id number] Curlene Labrum Readling, MD   400 mg at 12/08/11 1257  . hydrochlorothiazide (HYDRODIURIL) tablet 25 mg  25 mg Oral Daily Curlene Labrum Readling, MD   25 mg at 12/08/11 0751  . magnesium hydroxide (MILK OF MAGNESIA) suspension 30 mL  30 mL Oral Daily PRN Nehemiah Settle, MD   30 mL at 12/07/11 2128  . mulitivitamin with minerals tablet 1 tablet  1 tablet Oral Daily Ronny Bacon, MD   1 tablet at 12/08/11 0749  . ondansetron (ZOFRAN-ODT) disintegrating tablet 4 mg  4 mg Oral Q8H PRN Nehemiah Settle, MD   4 mg at 12/06/11 2340  .  QUEtiapine (SEROQUEL XR) 24 hr tablet 800 mg  800 mg Oral QHS Verne Spurr, PA-C   800 mg at 12/07/11 2109  . sertraline (ZOLOFT) tablet 100 mg  100 mg Oral Daily Curlene Labrum Readling, MD   100 mg at 12/08/11 0750  . simvastatin (ZOCOR) tablet 10 mg  10 mg Oral q1800 Verne Spurr, PA-C   10 mg at 12/08/11 1707  . sodium chloride (OCEAN) 0.65 % nasal spray 1 spray  1 spray Each Nare PRN Ronny Bacon, MD   1 spray at 12/07/11 1851  . thiamine (VITAMIN B-1) tablet 100 mg  100 mg Oral Daily Alyson Kuroski-Mazzei, DO   100 mg at 12/08/11 0750  . traZODone (DESYREL) tablet 100 mg  100 mg Oral QHS PRN Ronny Bacon, MD    100 mg at 12/06/11 2036  . tretinoin (RETIN-A) 0.025 % cream 1 application  1 application Topical QHS Verne Spurr, PA-C   1 application at 12/07/11 2200  . valACYclovir (VALTREX) tablet 1,000 mg  1,000 mg Oral Daily Verne Spurr, PA-C   1,000 mg at 12/08/11 1478   Lab Results: No results found for this or any previous visit (from the past 48 hour(s)).  Review of Systems:  Neurological: The patient denies any headaches today. She denies any seizures or dizziness.  G.I.: The patient denies any constipation or G.I. Upset today.  Musculoskeletal: The patient denies any muscle or skeletal difficulties.   Time was spent today discussing with the patient her current symptoms. The patient reports to having difficulty initiating and maintaining sleep last night.  Thinks meds are helping now but still has AVH at times and making some progress now.  The patient does report some nasal dryness and nose bleeds.   Treatment Plan Summary:  1. Daily contact with patient to assess and evaluate symptoms and progress in treatment.  2. Medication management  3. The patient will deny suicidal ideations or homicidal ideations for 48 hours prior to discharge and have a depression and anxiety rating of 3 or less. The patient will also deny any auditory or visual hallucinations or delusional thinking.  4. The patient will deny any symptoms of substance withdrawal at time of discharge.   Plan:   9. Will continue to monitor and current meds  Wonda Cerise 12/08/2011, 8:14 PM

## 2011-12-08 NOTE — Progress Notes (Signed)
Pt attended group this evening. Pt declined offer to attend the 300 hall AA meeting this evening. Pt has no plans on attending a AA/NA meeting here and reports that she disclosed this to her case manager. Pt became agitated this evening when staff member asked her to change her shorts. Pt shorts were tight and short. Pt eventually became cooperative and changed her clothing. Writer thanked pt for her cooperation. Pt is still experiencing some constipation. Prune juice was given to this pt. No results yet. Pt did report a small bowel movement after her suppository. Pt denies any A/V hallucinations or SI at this time. Pt remains safe with q23min checks.

## 2011-12-08 NOTE — Progress Notes (Addendum)
Nursing  note late entry  On 12/08/2011 Pt has been out in the milieu most of the day. She reported she is still having trouble sleeping...states she has had off and on nose bleeds ( MD will  Prescribe normal saline nose spray). She denies SI and attends some of her groups. POC to cont PD RN [facility]

## 2011-12-09 MED ORDER — MAGNESIUM CITRATE PO SOLN
1.0000 | Freq: Once | ORAL | Status: AC
  Administered 2011-12-09: 1 via ORAL

## 2011-12-09 MED ORDER — FLEET ENEMA 7-19 GM/118ML RE ENEM
1.0000 | ENEMA | Freq: Two times a day (BID) | RECTAL | Status: DC | PRN
  Administered 2011-12-09 – 2011-12-10 (×2): 1 via RECTAL
  Filled 2011-12-09 (×2): qty 1

## 2011-12-09 MED ORDER — CAMPHOR-MENTHOL 0.5-0.5 % EX LOTN
TOPICAL_LOTION | CUTANEOUS | Status: DC | PRN
  Administered 2011-12-12 – 2011-12-13 (×2): via TOPICAL
  Filled 2011-12-09 (×4): qty 222

## 2011-12-09 MED ORDER — TRAZODONE HCL 50 MG PO TABS
150.0000 mg | ORAL_TABLET | Freq: Every evening | ORAL | Status: DC | PRN
  Administered 2011-12-09 – 2011-12-20 (×5): 150 mg via ORAL
  Filled 2011-12-09 (×5): qty 1

## 2011-12-09 NOTE — Progress Notes (Addendum)
Patient ID: Lacey Moses, female   DOB: 12/17/1965, 46 y.o.   MRN: [id number] I was asked to see this patient by the nurse because of the patient's complaints of constipation. She has not had a bowel movement for several days. She reports she had a small amount of stool passed this morning but continues to feel a lot of pressure and fullness in her abdomen. She took somewhat laxative yesterday with inadequate results.  She reports she ate a full breakfast this morning. She reports she can feel rumbling in her abdomen. She denies any abdominal pain but feels fullness and pressure across the top and lower part of her abdomen. She reports no history of bowel surgery. She is HIV positive. And denies chronic history of constipation.  Physical exam: Completely alert, pulse 118, afebrile. Bowel sounds are present in the left lower quadrant, right lower quadrant, and right upper. Bowel sounds not heard in the left upper quadrant. The abdomen is rounded and full in appearance and nontender to palpation.  Plan: 1 bottle mag citrate. Fleets enema x2 when necessary.

## 2011-12-09 NOTE — Progress Notes (Signed)
Pt continues to experience some feelings of constipation, which she states in normal for her from time to time. Pt given two servings of warm prune juice per her request. According to [facility], pt has received Dulcolax tablet yesterday morning, and a suppository and MOM at HS. Writer discusses chance of "urgency" when she finally has a BM, pt verbalizes understanding. Pt denies abd pain and BS are present x4. Pt states she feels "bloated", but no abdominal distention noted. Satisfied with prune juice at present. Remains safe and on Q58min checks.

## 2011-12-09 NOTE — Progress Notes (Signed)
Patient ID: Lacey Moses, female   DOB: July 18, 1965, 46 y.o.   MRN: [id number] Pt out in milieu in group room. Pt encouraging other patients to ask for prn sleep meds and informing them of what she had gotten prn. Pt requesting multiple medications and with various somatic complaints. Pt laughing and talking loudly in group room. No s/s of depression noted at this time. Pt denies SI at this time.

## 2011-12-09 NOTE — Progress Notes (Signed)
Patient ID: Lacey Moses, female   DOB: January 14, 1966, 46 y.o.   MRN: [id number]  [facility] Group Notes:  (Counselor/Nursing/MHT/Case Management/Adjunct)  12/09/2011 11 AM  Type of Therapy:  Aftercare Planning, Group Therapy, Dance/Movement Therapy   Participation Level:  Did Not Attend  Rhunette Croft

## 2011-12-09 NOTE — Progress Notes (Signed)
Patient ID: Lacey Moses, female   DOB: May 25, 1966, 46 y.o.   MRN: [id number] [facility] MD Progress Note  12/09/2011 7:27 PM  Diagnosis:  Axis I: Major Depressive Disorder - Recurrent - Severe.  Alcohol Dependence.  Cocaine Abuse.  Cannabis Abuse.   The patient was seen today and reports the following:   ADL's: Intact.  Sleep: The patient reports to having difficulty sleeping last night.  Appetite: The patient reports a good appetite today.   Mild>(1-10) >Severe  Hopelessness (1-10): 8 Depression (1-10): 9 Anxiety (1-10): 9   Suicidal Ideation: The patient reports suicidal ideations today.  Plan: No  Intent: No  Means: No   Homicidal Ideation: The patient denies any homicidal ideations today.  Plan: No  Intent: No.  Means: No   General Appearance/Behavior: The patient remains cooperative today with this provider.  Eye Contact: Good.  Speech: Appropriate in rate and volume with no pressuring noted today.  Motor Behavior: wnl.  Level of Consciousness: Alert and Oriented x 3.  Mental Status: Alert and Oriented x 3.  Mood: Severely depressed today.  Affect: Severely constricted.  Anxiety Level: Severe anxiety reported today.  Thought Process: wnl.  Thought Content: AVH, no si Perception:. wnl.  Judgment: Fair.  Insight: Fair.  Cognition: Oriented to person, place and time.  Sleep:  Number of Hours: 5.5    Vital Signs:Blood pressure 112/76, pulse 118, temperature 98 F (36.7 C), temperature source Oral, resp. rate 20, height 5\' 4"  (1.626 m), weight 78.019 kg (172 lb).  Current Medications: Current Facility-Administered Medications  Medication Dose Route Frequency Provider Last Rate Last Dose  . acetaminophen (TYLENOL) tablet 650 mg  650 mg Oral Q6H PRN Nehemiah Settle, MD   650 mg at 12/08/11 1707  . alum & mag hydroxide-simeth (MAALOX/MYLANTA) 200-200-20 MG/5ML suspension 30 mL  30 mL Oral Q4H PRN Nehemiah Settle, MD      . bisacodyl (DULCOLAX) EC  tablet 5 mg  5 mg Oral Daily PRN Mickie D. Adams, PA   5 mg at 12/08/11 0931  . camphor-menthol (SARNA) lotion   Topical PRN Wonda Cerise, MD      . cephALEXin (KEFLEX) capsule 500 mg  500 mg Oral TID WC & HS Curlene Labrum Readling, MD   500 mg at 12/09/11 1702  . diphenhydrAMINE (BENADRYL) capsule 50 mg  50 mg Oral Q6H PRN Verne Spurr, PA-C   50 mg at 12/07/11 1557  . efavirenz-emtrictabine-tenofovir (ATRIPLA) 600-200-300 MG per tablet 1 tablet  1 tablet Oral QHS Ronny Bacon, MD   1 tablet at 12/08/11 2108  . fluocinonide (LIDEX) 0.05 % external solution 1 application  1 application Topical QPM Verne Spurr, PA-C   1 application at 12/09/11 1704  . gabapentin (NEURONTIN) capsule 400 mg  400 mg Oral [id number] Curlene Labrum Readling, MD   400 mg at 12/09/11 1417  . hydrochlorothiazide (HYDRODIURIL) tablet 25 mg  25 mg Oral Daily Curlene Labrum Readling, MD   25 mg at 12/09/11 0759  . magnesium citrate solution 1 Bottle  1 Bottle Oral Once Viviann Spare, FNP   1 Bottle at 12/09/11 1123  . magnesium hydroxide (MILK OF MAGNESIA) suspension 30 mL  30 mL Oral Daily PRN Nehemiah Settle, MD   30 mL at 12/07/11 2128  . mulitivitamin with minerals tablet 1 tablet  1 tablet Oral Daily Ronny Bacon, MD   1 tablet at 12/09/11 0758  . ondansetron (ZOFRAN-ODT) disintegrating tablet 4 mg  4 mg Oral  Q8H PRN Nehemiah Settle, MD   4 mg at 12/06/11 2340  . QUEtiapine (SEROQUEL XR) 24 hr tablet 800 mg  800 mg Oral QHS Verne Spurr, PA-C   800 mg at 12/08/11 2108  . sertraline (ZOLOFT) tablet 100 mg  100 mg Oral Daily Curlene Labrum Readling, MD   100 mg at 12/09/11 0759  . simvastatin (ZOCOR) tablet 10 mg  10 mg Oral q1800 Verne Spurr, PA-C   10 mg at 12/09/11 1702  . sodium chloride (OCEAN) 0.65 % nasal spray 1 spray  1 spray Each Nare PRN Ronny Bacon, MD   1 spray at 12/07/11 1851  . sodium phosphate (FLEET) 7-19 GM/118ML enema 1 enema  1 enema Rectal BID PRN Viviann Spare, FNP   1 enema at 12/09/11  1123  . thiamine (VITAMIN B-1) tablet 100 mg  100 mg Oral Daily Alyson Kuroski-Mazzei, DO   100 mg at 12/09/11 0758  . traZODone (DESYREL) tablet 150 mg  150 mg Oral QHS PRN Wonda Cerise, MD      . tretinoin (RETIN-A) 0.025 % cream 1 application  1 application Topical QHS Verne Spurr, PA-C   1 application at 12/08/11 2200  . valACYclovir (VALTREX) tablet 1,000 mg  1,000 mg Oral Daily Verne Spurr, PA-C   1,000 mg at 12/09/11 0758  . DISCONTD: traZODone (DESYREL) tablet 100 mg  100 mg Oral QHS PRN Ronny Bacon, MD   100 mg at 12/06/11 2036   Lab Results: No results found for this or any previous visit (from the past 48 hour(s)).  Review of Systems:  Neurological: The patient denies any headaches today. She denies any seizures or dizziness.  G.I.: The patient denies any constipation or G.I. Upset today.  Musculoskeletal: The patient denies any muscle or skeletal difficulties.   Time was spent today discussing with the patient her current symptoms. Upset because of constipation now. Wanted enema today.The patient reports to having difficulty initiating and maintaining sleep last night.  Thinks meds are helping now but still has AVH at times.  The patient does report some nasal dryness and nose bleeds.   Treatment Plan Summary:  1. Daily contact with patient to assess and evaluate symptoms and progress in treatment.  2. Medication management  3. The patient will deny suicidal ideations or homicidal ideations for 48 hours prior to discharge and have a depression and anxiety rating of 3 or less. The patient will also deny any auditory or visual hallucinations or delusional thinking.  4. The patient will deny any symptoms of substance withdrawal at time of discharge.   Plan:   9. Will increase trazodone for sleep  Wonda Cerise 12/09/2011, 7:27 PM

## 2011-12-09 NOTE — Progress Notes (Addendum)
Pt continues to say she is constipated though she has had 2 small stools   Her abdomen is distended and she said she is having cramps  She is irritable and intrusive  She does not attend groups and interacts minimally with others  Verbal support given  Medications administered and effectiveness monitored  Q 15 min checks  Pt safe at present    Informed np about pt complaint of constipation and what had been done for her  Listened for bowel sounds but was unable to hear any also informed np of same  Am waiting further instructions

## 2011-12-10 MED ORDER — MAGNESIUM CITRATE PO SOLN: 1.0000 | Freq: Once | ORAL | Status: DC

## 2011-12-10 NOTE — Progress Notes (Signed)
Pt has been up and has been visible and active while in the milieu today, has been attending and participating in various milieu activities today, pt did report having difficulty sleeping last night, pt did speak about having some feelings of depression today, has denied any suicidal ideations, spoke about wanting to be clean and sober and did speak about being open to go to a long term treatment facility, pt has received all medications without incident, support provided, will continue to monitor

## 2011-12-10 NOTE — Progress Notes (Signed)
[facility] MD Progress Note  12/10/2011 4:57 PM  Diagnosis:  Axis I: Major Depressive Disorder - Recurrent - Severe.  Alcohol Dependence.  Cocaine Abuse.  Cannabis Abuse.   The patient was seen today and reports the following:   ADL's: Intact.  Sleep: The patient reports to having ongoing difficulty sleeping last night.  Appetite: The patient reports a good appetite today.   Mild>(1-10) >Severe  Hopelessness (1-10): 5  Depression (1-10): 6-7  Anxiety (1-10): 5  Suicidal Ideation: The patient denies any suicidal ideations today.  Plan: No  Intent: No  Means: No   Homicidal Ideation: The patient denies any homicidal ideations today.  Plan: No  Intent: No.  Means: No   General Appearance/Behavior: The patient remains cooperative today with this provider.  Eye Contact: Good.  Speech: Appropriate in rate and volume with no pressuring noted today.  Motor Behavior: wnl.  Level of Consciousness: Alert and Oriented x 3.  Mental Status: Alert and Oriented x 3.  Mood: Moderately depressed today.  Affect: Moderately constricted.  Anxiety Level: Moderate anxiety reported today.  Thought Process: wnl.  Thought Content: The patient denies any auditory or visual hallucinations today as well as any delusional thinking.  Perception:. wnl.  Judgment: Fair.  Insight: Fair.  Cognition: Oriented to person, place and time.  Sleep:  Number of Hours: 4.75    Vital Signs:Blood pressure 118/74, pulse 103, temperature 98.3 F (36.8 C), temperature source Oral, resp. rate 20, height 5\' 4"  (1.626 m), weight 78.019 kg (172 lb).  Current Medications: Current Facility-Administered Medications  Medication Dose Route Frequency Provider Last Rate Last Dose  . acetaminophen (TYLENOL) tablet 650 mg  650 mg Oral Q6H PRN Nehemiah Settle, MD   650 mg at 12/10/11 0808  . alum & mag hydroxide-simeth (MAALOX/MYLANTA) 200-200-20 MG/5ML suspension 30 mL  30 mL Oral Q4H PRN Nehemiah Settle, MD       . bisacodyl (DULCOLAX) EC tablet 5 mg  5 mg Oral Daily PRN Mickie D. Adams, PA   5 mg at 12/08/11 0931  . camphor-menthol (SARNA) lotion   Topical PRN Wonda Cerise, MD      . cephALEXin (KEFLEX) capsule 500 mg  500 mg Oral TID WC & HS Curlene Labrum Desarie Feild, MD   500 mg at 12/10/11 1315  . diphenhydrAMINE (BENADRYL) capsule 50 mg  50 mg Oral Q6H PRN Verne Spurr, PA-C   50 mg at 12/09/11 2140  . efavirenz-emtrictabine-tenofovir (ATRIPLA) 600-200-300 MG per tablet 1 tablet  1 tablet Oral QHS Ronny Bacon, MD   1 tablet at 12/09/11 2119  . fluocinonide (LIDEX) 0.05 % external solution 1 application  1 application Topical QPM Ronny Bacon, MD   1 application at 12/09/11 1704  . gabapentin (NEURONTIN) capsule 400 mg  400 mg Oral [id number] Curlene Labrum Makenze Ellett, MD   400 mg at 12/10/11 1315  . hydrochlorothiazide (HYDRODIURIL) tablet 25 mg  25 mg Oral Daily Curlene Labrum Kamelia Lampkins, MD   25 mg at 12/10/11 0806  . magnesium citrate solution 1 Bottle  1 Bottle Oral Once Curlene Labrum Danise Dehne, MD      . magnesium hydroxide (MILK OF MAGNESIA) suspension 30 mL  30 mL Oral Daily PRN Nehemiah Settle, MD   30 mL at 12/07/11 2128  . mulitivitamin with minerals tablet 1 tablet  1 tablet Oral Daily Ronny Bacon, MD   1 tablet at 12/10/11 0806  . ondansetron (ZOFRAN-ODT) disintegrating tablet 4 mg  4 mg Oral Q8H  PRN Nehemiah Settle, MD   4 mg at 12/06/11 2340  . QUEtiapine (SEROQUEL XR) 24 hr tablet 800 mg  800 mg Oral QHS Verne Spurr, PA-C   800 mg at 12/09/11 2120  . sertraline (ZOLOFT) tablet 100 mg  100 mg Oral Daily Curlene Labrum Dajohn Ellender, MD   100 mg at 12/10/11 0806  . simvastatin (ZOCOR) tablet 10 mg  10 mg Oral q1800 Verne Spurr, PA-C   10 mg at 12/09/11 1702  . sodium chloride (OCEAN) 0.65 % nasal spray 1 spray  1 spray Each Nare PRN Ronny Bacon, MD   1 spray at 12/07/11 1851  . sodium phosphate (FLEET) 7-19 GM/118ML enema 1 enema  1 enema Rectal BID PRN Viviann Spare, FNP   1 enema at  12/10/11 1352  . thiamine (VITAMIN B-1) tablet 100 mg  100 mg Oral Daily Alyson Kuroski-Mazzei, DO   100 mg at 12/10/11 0806  . traZODone (DESYREL) tablet 150 mg  150 mg Oral QHS PRN Wonda Cerise, MD   150 mg at 12/09/11 2140  . tretinoin (RETIN-A) 0.025 % cream 1 application  1 application Topical QHS Verne Spurr, PA-C   1 application at 12/09/11 2200  . valACYclovir (VALTREX) tablet 1,000 mg  1,000 mg Oral Daily Verne Spurr, PA-C   1,000 mg at 12/10/11 0806  . DISCONTD: traZODone (DESYREL) tablet 100 mg  100 mg Oral QHS PRN Ronny Bacon, MD   100 mg at 12/06/11 2036   Lab Results: No results found for this or any previous visit (from the past 48 hour(s)).  Review of Systems:  Neurological: The patient denies any headaches today. She denies any seizures or dizziness.  G.I.: The patient reports constipation and G.I. Upset today.  Musculoskeletal: The patient denies any muscle or skeletal difficulties.   Time was spent today discussing with the patient her current symptoms. The patient reports to having ongoing difficulty initiating and maintaining sleep last night. She reports a good appetite and reports moderate feelings of sadness, anhedonia and depressed mood as well as moderate anxiety.  She denies any suicidal or homicidal ideations and denies any auditory or visual hallucinations or delusional thinking today.   The patient reports concerns about constipation today and this will be addressed.  Treatment Plan Summary:  1. Daily contact with patient to assess and evaluate symptoms and progress in treatment.  2. Medication management  3. The patient will deny suicidal ideations or homicidal ideations for 48 hours prior to discharge and have a depression and anxiety rating of 3 or less. The patient will also deny any auditory or visual hallucinations or delusional thinking.  4. The patient will deny any symptoms of substance withdrawal at time of discharge.   Plan:  1. Will continue  the medication Zoloft at 100 mgs po q am for depression and anxiety.  2. Will continue the medication Neurontin to 400 mgs po q am, 2 pm and hs for anxiety and mood stabilization.  3. Will continue the medication Seroquel XR 800 mgs po qhs for sleep and mood stabilization.  4. Will continue the medication Trazodone 100 mgs po qhs - prn for sleep.  5. Will order 1 bottle of Magnesium Citrate for constipation. 6. Will continue the patient on her non-psychiatric medications.  7. Laboratory studies reviewed.  8. Will continue to monitor.   Lacey Moses 12/10/2011, 4:57 PM

## 2011-12-10 NOTE — Progress Notes (Addendum)
In hallway on approach. Appears bright. Calm and cooperative with assessment. Open and spontaneous in conversation. No acute distress noted. States she has had a bad day r/t constipation. States she tried a few things today and had some small results, but not enough to relieve her abdominal discomfort. Support and encouragement provided. States she intends to discuss with MD in AM if she doesn't get any relief tonight. Does feel like her mood has improved vs her admission status. Had some questions about medications. Information was reviewed and understanding verbalized. Did request prn for sleep which was provided. Otherwise offered no questions or concerns. Denies SI/HI/AVH and contracts for safety. Safety has been maintained with Q15 minute observation. Will f/u response to prn and continue to monitor.

## 2011-12-10 NOTE — Discharge Planning (Signed)
Met with patient in Aftercare Planning Group.   She appeared to be less depressed, had better eye contact and affect was significantly less constricted; however, when Case Manager asked the group how the weekend was, she talked extensively of how horrible the weekend was because she was not able to sleep and is having problems with her bowel movements.  She said her sister came to visit yesterday but was not able to meet with the treatment team.  Case Manager explained that treatment team is held Monday-Friday, and that we had discussed her sister coming today.  She is going to attempt to get her sister to leave work tomorrow long enough to come to the treatment team.    Case Manager asked patient if she had worked on and/or completed the application and autobiography required to apply to [employer].  She stated she will not do that until she has gone over it with her mentor.  Case Manager urged her to do that as soon as possible, since applying to a program does not ensure acceptance so she may have to apply for another.  She would not explain why it would be important to have her mentor's input, and in fact this is the first time she has mentioned this individual to Case Manager.    Utilization review to be done today to request additional days.  Ambrose Mantle, LCSW 12/10/2011, 12:47 PM

## 2011-12-10 NOTE — Progress Notes (Signed)
12/10/2011         Time: 0930      Group Topic/Focus: The focus of this group is on enhancing patients' problem solving skills, which involves identifying the problem, brainstorming solutions and choosing and trying a solution.   Participation Level: Active  Participation Quality: Attentive  Affect: Labile  Cognitive: Alert   Additional Comments: Patient initially irritable, saying she needed to make phone calls during group. Patient quickly settled once group began, said she had woken up in a bad mood but was feeling better now. Patient reports strained relationships with family and friends because of her substance abuse but has begun talking to her sister again this weekend.   Lacey Moses 12/10/2011 10:06 AM

## 2011-12-11 ENCOUNTER — Inpatient Hospital Stay (HOSPITAL_COMMUNITY): Payer: PRIVATE HEALTH INSURANCE

## 2011-12-11 DIAGNOSIS — L738 Other specified follicular disorders: Secondary | ICD-10-CM

## 2011-12-11 DIAGNOSIS — K5900 Constipation, unspecified: Secondary | ICD-10-CM | POA: Diagnosis present

## 2011-12-11 DIAGNOSIS — L678 Other hair color and hair shaft abnormalities: Secondary | ICD-10-CM

## 2011-12-11 MED ORDER — POLYETHYLENE GLYCOL 3350 17 G PO PACK
17.0000 g | PACK | Freq: Every day | ORAL | Status: DC
  Administered 2011-12-12: 17 g via ORAL
  Filled 2011-12-11 (×3): qty 1

## 2011-12-11 MED ORDER — SERTRALINE HCL 50 MG PO TABS
150.0000 mg | ORAL_TABLET | Freq: Every day | ORAL | Status: DC
  Administered 2011-12-12 – 2011-12-26 (×15): 150 mg via ORAL
  Filled 2011-12-11 (×17): qty 1

## 2011-12-11 MED ORDER — FLUOCINONIDE 0.05 % EX SOLN: 1.0000 "application " | Freq: Two times a day (BID) | CUTANEOUS | Status: DC

## 2011-12-11 MED ORDER — FLUOCINONIDE 0.05 % EX SOLN
1.0000 "application " | Freq: Two times a day (BID) | CUTANEOUS | Status: DC
  Administered 2011-12-12 – 2011-12-27 (×16): 1 via TOPICAL
  Filled 2011-12-11: qty 60

## 2011-12-11 MED ORDER — QUETIAPINE FUMARATE ER 400 MG PO TB24
1000.0000 mg | ORAL_TABLET | Freq: Every day | ORAL | Status: DC
  Administered 2011-12-11 – 2011-12-14 (×4): 1000 mg via ORAL
  Filled 2011-12-11 (×6): qty 1

## 2011-12-11 MED ORDER — MAGNESIUM CITRATE PO SOLN
1.0000 | Freq: Once | ORAL | Status: AC
  Administered 2011-12-11: 1 via ORAL

## 2011-12-11 NOTE — Progress Notes (Signed)
Pt rates depression as an 8 with auditory hallucinations. She c/o constipation today and abdominal pain. Gave prn medication for pain and constipation. Pt awake in her bed currently and reports that she had a small BM today. Offered support and 15 minute checks. Safety maintained on unit.

## 2011-12-11 NOTE — Progress Notes (Signed)
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  Alcohol Dependence.  Cocaine Abuse.  Cannabis Abuse.   The patient was seen today and reports the following:   ADL's: Intact.  Sleep: The patient reports to having ongoing difficulty sleeping at night.  Appetite: The patient reports a good appetite today.   Mild>(1-10) >Severe  Hopelessness (1-10): 7  Depression (1-10): 8  Anxiety (1-10): 7   Suicidal Ideation: The patient denies any suicidal ideations today.  Plan: No  Intent: No  Means: No   Homicidal Ideation: The patient denies any homicidal ideations today.  Plan: No  Intent: No.  Means: No   General Appearance/Behavior: The patient remains cooperative today with this provider.  Eye Contact: Good.  Speech: Appropriate in rate and volume with no pressuring noted today.  Motor Behavior: wnl.  Level of Consciousness: Alert and Oriented x 3.  Mental Status: Alert and Oriented x 3.  Mood: Moderate to severely depressed today.  Affect: Moderately constricted.  Anxiety Level: Moderate anxiety reported today.  Thought Process: wnl.  Thought Content: The patient denies any auditory or visual hallucinations today as well as any delusional thinking.  Perception:. wnl.  Judgment: Fair.  Insight: Fair.  Cognition: Oriented to person, place and time.  Sleep:  Number of Hours: 4    Vital Signs:Blood pressure 113/81, pulse 95, temperature 97.2 F (36.2 C), temperature source Oral, resp. rate 20, height 5\' 4"  (1.626 m), weight 78.019 kg (172 lb).  Current Medications: Current Facility-Administered Medications  Medication Dose Route Frequency Provider Last Rate Last Dose  . acetaminophen (TYLENOL) tablet 650 mg  650 mg Oral Q6H PRN Nehemiah Settle, MD   650 mg at 12/10/11 0808  . alum & mag hydroxide-simeth (MAALOX/MYLANTA) 200-200-20 MG/5ML suspension 30 mL  30 mL Oral Q4H PRN Nehemiah Settle, MD       . bisacodyl (DULCOLAX) EC tablet 5 mg  5 mg Oral Daily PRN Mickie D. Adams, PA   5 mg at 12/11/11 0847  . camphor-menthol (SARNA) lotion   Topical PRN Wonda Cerise, MD      . cephALEXin (KEFLEX) capsule 500 mg  500 mg Oral TID WC & HS Curlene Labrum Malcome Ambrocio, MD   500 mg at 12/11/11 1145  . diphenhydrAMINE (BENADRYL) capsule 50 mg  50 mg Oral Q6H PRN Verne Spurr, PA-C   50 mg at 12/10/11 2256  . efavirenz-emtrictabine-tenofovir (ATRIPLA) 600-200-300 MG per tablet 1 tablet  1 tablet Oral QHS Ronny Bacon, MD   1 tablet at 12/10/11 2137  . fluocinonide (LIDEX) 0.05 % external solution 1 application  1 application Topical QPM Ronny Bacon, MD   1 application at 12/09/11 1704  . gabapentin (NEURONTIN) capsule 400 mg  400 mg Oral BH-[id number] Curlene Labrum Ayden Apodaca, MD   400 mg at 12/11/11 0841  . hydrochlorothiazide (HYDRODIURIL) tablet 25 mg  25 mg Oral Daily Curlene Labrum Cassanda Walmer, MD   25 mg at 12/11/11 0842  . magnesium citrate solution 1 Bottle  1 Bottle Oral Once Curlene Labrum Kyla Duffy, MD      . magnesium hydroxide (MILK OF MAGNESIA) suspension 30 mL  30 mL Oral Daily PRN Nehemiah Settle, MD   30 mL at 12/11/11 0008  . mulitivitamin with minerals tablet 1 tablet  1 tablet Oral Daily Ronny Bacon, MD   1 tablet at 12/11/11 0842  . ondansetron (ZOFRAN-ODT) disintegrating tablet 4 mg  4  mg Oral Q8H PRN Nehemiah Settle, MD   4 mg at 12/06/11 2340  . QUEtiapine (SEROQUEL XR) 24 hr tablet 800 mg  800 mg Oral QHS Verne Spurr, PA-C   800 mg at 12/10/11 2137  . sertraline (ZOLOFT) tablet 100 mg  100 mg Oral Daily Curlene Labrum Folashade Gamboa, MD   100 mg at 12/11/11 0843  . simvastatin (ZOCOR) tablet 10 mg  10 mg Oral q1800 Verne Spurr, PA-C   10 mg at 12/10/11 1729  . sodium chloride (OCEAN) 0.65 % nasal spray 1 spray  1 spray Each Nare PRN Ronny Bacon, MD   1 spray at 12/07/11 1851  . sodium phosphate (FLEET) 7-19 GM/118ML enema 1 enema  1 enema Rectal BID PRN Viviann Spare, FNP   1 enema at  12/10/11 1352  . thiamine (VITAMIN B-1) tablet 100 mg  100 mg Oral Daily Alyson Kuroski-Mazzei, DO   100 mg at 12/11/11 0843  . traZODone (DESYREL) tablet 150 mg  150 mg Oral QHS PRN Wonda Cerise, MD   150 mg at 12/10/11 2141  . tretinoin (RETIN-A) 0.025 % cream 1 application  1 application Topical QHS Verne Spurr, PA-C   1 application at 12/09/11 2200  . valACYclovir (VALTREX) tablet 1,000 mg  1,000 mg Oral Daily Verne Spurr, PA-C   1,000 mg at 12/11/11 7829   Lab Results: No results found for this or any previous visit (from the past 48 hour(s)).  Review of Systems:  Neurological: The patient denies any headaches today. She denies any seizures or dizziness.  G.I.: The patient reports constipation and G.I. Upset today.  Musculoskeletal: The patient denies any muscle or skeletal difficulties.  Skin:  Numerous insect bites on arms and legs.  Time was spent today discussing with the patient her current symptoms. The patient reports to having ongoing difficulty maintaining sleep at night. She reports a good appetite and reports moderate to severe feelings of sadness, anhedonia and depressed mood.  She also reports moderate anxiety. She denies any suicidal or homicidal ideations today and denies any auditory or visual hallucinations or delusional thinking today.   The patient has several somatic complaints today including "Stomach bloating" and numerous insect bites which she states occurred prior to admission.    Treatment Plan Summary:  1. Daily contact with patient to assess and evaluate symptoms and progress in treatment.  2. Medication management  3. The patient will deny suicidal ideations or homicidal ideations for 48 hours prior to discharge and have a depression and anxiety rating of 3 or less. The patient will also deny any auditory or visual hallucinations or delusional thinking.  4. The patient will deny any symptoms of substance withdrawal at time of discharge.   Plan:  1. Will  increase the medication Zoloft to 150 mgs po q am for depression and anxiety.  2. Will continue the medication Neurontin to 400 mgs po q am, 2 pm and hs for anxiety and mood stabilization.  3. Will continue the medication Seroquel XR 1000 mgs po qhs for sleep and mood stabilization.  4. Will continue the medication Trazodone 100 mgs po qhs - prn for sleep.  5. Will order a Medicine Consult to evaluate the patient's multiple medical issues.  6. Will continue the patient on her non-psychiatric medications.  7. Laboratory studies reviewed.  8. Will continue to monitor.  9. Case Manager is continuing to work on discharge planning for the patient.  Lacey Moses 12/11/2011, 12:50 PM

## 2011-12-11 NOTE — Care Management (Signed)
Met with patient in Aftercare Planning Group.   She did not know if her mentor would be here today for the Treatment Team.  Her mentor did show up, and was not seen immediately because the team was not aware that she was coming.  An individual meeting was held with mentor Rev. Nikki Dom ([phone], patient, Counselor, and Case Manager.    There was a lengthy discussion about patient's refusal to attend 300 hall groups, and it finally came out that she has only attended one such group, and felt that it was a recounting of "war stories" that was not helpful for her so she left.  She complained that the 300 hall patients talk in the lunch line about leaving the hospital and using also, so she does not want to go back there for groups.  Patient's mentor was diligent in urging her to cooperate with the treatment, and reminded her that we are simply trying to get her to start her substance abuse treatment here, and that we have a legitimate concern about what will happen once she arrives at a treatment facility and hears the same type of things in groups.  Patient also stated she is here for her mental health, and she did not show much insight when we kept trying to point out that the 300 hall is a dual diagnosis hall, and that everyone there is trying to work on mental health issuse as well.  Patient never committed fully to trying the 300 hall groups again, but at one point did say she would try one more day.  She will not be allowed to attend the 400 hall groups, however, if she does continue to refuse the 300 hall groups.  There was also a discussion about patient applying for a treatment program, and starting the process now rather than waiting any longer.  She agreed to start the application to [facility].  Case Manager called the facility, and one necessity would be for patient's Seroquel and Trazodone to be replaced by other medications.  Case Manager is now awaiting a call back from the  intake coordinator there.  If by chance the patient were to be accepted to this [facility] program or ostensibly to another distant program, the mentor agrees to provide transportation.  Patient talked briefly about her anger issues, and had a difficult time reigning in her temper at times during the meeting.  However, Case Manager did challenge her repeatedly in the meeting to take responsibility for pursuing the available options, and patient was able to stay relatively calm and accept the recommendations.  Ambrose Mantle, LCSW 12/11/2011, 1:50 PM

## 2011-12-11 NOTE — Progress Notes (Signed)
[facility] Group Notes:  (Counselor/Nursing/MHT/Case Management/Adjunct)  12/11/2011 2:07 PM  Type of Therapy:  Group Therapy  Participation Level:  Did Not Attend    Veto Kemps 12/11/2011, 2:07 PM

## 2011-12-11 NOTE — Consult Note (Signed)
Hospital Admission Note Date: 12/11/2011  Patient name: Lacey Moses Medical record number: [id number] Date of birth: 05-07-1966 Age: 46 y.o. Gender: female PCP: No primary provider on file.  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 Pt was started on Keflex on 5/29/213 for treatment of folliculitis. Pt has also had constipation which has not improved. The constipation has not affected her oral intake. She has received fleets enema but no laxatives or significant agents to increase gut peristalsis. Pt states that she doesn't usually have constipation.  Scheduled Meds:   . cephALEXin  500 mg Oral TID WC & HS  . efavirenz-emtrictabine-tenofovir  1 tablet Oral QHS  . fluocinonide  1 application Topical BID  . gabapentin  400 mg Oral [id number]  . hydrochlorothiazide  25 mg Oral Daily  . magnesium citrate  1 Bottle Oral Once  . mulitivitamin with minerals  1 tablet Oral Daily  . QUEtiapine  1,000 mg Oral QHS  . sertraline  150 mg Oral Daily  . simvastatin  10 mg Oral q1800  . thiamine  100 mg Oral Daily  . tretinoin  1 application Topical QHS  . valACYclovir  1,000 mg Oral Daily  . DISCONTD: fluocinonide  1 application Topical QPM  . DISCONTD: fluocinonide  1 application Topical BID  . DISCONTD: QUEtiapine  800 mg Oral QHS  . DISCONTD: sertraline  100 mg Oral Daily   Continuous Infusions:  PRN Meds:.acetaminophen, alum & mag hydroxide-simeth, bisacodyl, camphor-menthol, diphenhydrAMINE, magnesium hydroxide, ondansetron, sodium chloride, sodium phosphate, traZODone Allergies: Review of patient's allergies indicates no known allergies. Past Medical History  Diagnosis Date  . Depression   . Bipolar 1 disorder   . Suicide attempt   . HIV positive   . Hypertension   . High cholesterol   . Heart murmur   . Pancreatitis   . GERD (gastroesophageal  reflux disease)    Past Surgical History  Procedure Date  . Ectopic pregnancy surgery    No family history on file. History   Social History  . Marital Status: Single    Spouse Name: N/A    Number of Children: N/A  . Years of Education: N/A   Occupational History  . Not on file.   Social History Main Topics  . Smoking status: Current Everyday Smoker -- 1.0 packs/day    Types: Cigarettes  . Smokeless tobacco: Never Used  . Alcohol Use: Yes     everything daily  . Drug Use: Yes    Special: Cocaine, Marijuana  . Sexually Active:    Other Topics Concern  . Not on file   Social History Narrative  . No narrative on file   Review of Systems: A comprehensive review of systems was negative. Physical Exam: No intake or output data in the 24 hours ending 12/11/11 1853 General: Alert, awake, oriented x3, in no acute distress. Walking around without difficulty HEENT: [place]/AT PEERL, EOMI OROPHARYNX:  Moist, No exudate/ erythema/lesions.  Heart: Regular rate and rhythm, without murmurs, rubs, gallops, PMI non-displaced, no heaves or thrills on palpation.  Lungs: Clear to auscultation, no wheezing or rhonchi noted. No increased vocal fremitus resonant to percussion  Abdomen: Soft, nontender, nondistended, positive bowel sounds, no masses no hepatosplenomegaly noted..  Neuro: No focal neurological deficits noted Skin: Healed follicular appearing lesions. Skin is warm and dry with no active rash or lesions. Lab results: No results found  for this basename: NA:2,K:2,CL:2,CO2:2,GLUCOSE:2,BUN:2,CREATININE:2,CALCIUM:2,MG:2,PHOS:2 in the last 72 hours No results found for this basename: AST:2,ALT:2,ALKPHOS:2,BILITOT:2,PROT:2,ALBUMIN:2 in the last 72 hours No results found for this basename: LIPASE:2,AMYLASE:2 in the last 72 hours No results found for this basename: WBC:2,NEUTROABS:2,HGB:2,HCT:2,MCV:2,PLT:2 in the last 72 hours No results found for this basename:  CKTOTAL:3,CKMB:3,CKMBINDEX:3,TROPONINI:3 in the last 72 hours No components found with this basename: POCBNP:3 No results found for this basename: DDIMER:2 in the last 72 hours No results found for this basename: HGBA1C:2 in the last 72 hours No results found for this basename: CHOL:2,HDL:2,LDLCALC:2,TRIG:2,CHOLHDL:2,LDLDIRECT:2 in the last 72 hours No results found for this basename: TSH,T4TOTAL,FREET3,T3FREE,THYROIDAB in the last 72 hours No results found for this basename: VITAMINB12:2,FOLATE:2,FERRITIN:2,TIBC:2,IRON:2,RETICCTPCT:2 in the last 72 hours Imaging results:  Dg Chest 2 View  12/02/2011  *RADIOLOGY REPORT*  Clinical Data: Cough, weakness.  HIV.  CHEST - 2 VIEW  Comparison: None.  Findings: Heart is normal in size.  Lungs are free of focal consolidations and pleural effusions.  There is mild prominence of interstitial markings.  No edema. Visualized osseous structures have a normal appearance.  IMPRESSION: No evidence for acute cardiopulmonary abnormality.  Original Report Authenticated By: Patterson Hammersmith, M.D.   Other results:    Patient Active Hospital Problem List: Constipation (12/11/2011)   Assessment: Pt has been receiving fleets enema with apparently poor results. Pt is also on Seroquel which also contributes to constipation   Recommendations:   1. Magnesium Citrate and Soap suds enema tonight.   2. Check electrolytes and correct as indicated   3. Daily Miralax   4. Stool softeners  Healed Skin lesions   Assessment: Pt apparently had folliculitis that was treated by the Pcychiatrist. There are no active lesions that need to be addressed.  Himani Corona A. 12/11/2011, 6:53 PM

## 2011-12-11 NOTE — Progress Notes (Signed)
Pt complains of stomach pain and constipation. Pt had an ultra sound and it did show she was backed up. RN will give ordered soup suds enema when it arrives from pharmacy. Pt was offered support and encouragement. Pt is receptive to treatment and safety maintained on the unit.

## 2011-12-12 DIAGNOSIS — K5900 Constipation, unspecified: Secondary | ICD-10-CM

## 2011-12-12 DIAGNOSIS — L678 Other hair color and hair shaft abnormalities: Secondary | ICD-10-CM

## 2011-12-12 DIAGNOSIS — L738 Other specified follicular disorders: Secondary | ICD-10-CM

## 2011-12-12 LAB — BASIC METABOLIC PANEL
BUN: 17 mg/dL (ref 6–23)
CO2: 30 mEq/L (ref 19–32)
Calcium: 9 mg/dL (ref 8.4–10.5)
Chloride: 101 mEq/L (ref 96–112)
Creatinine, Ser: 0.79 mg/dL (ref 0.50–1.10)
GFR calc Af Amer: >90 mL/min (ref >90)
GFR calc non Af Amer: >90 mL/min (ref >90)
Glucose, Bld: 76 mg/dL (ref 70–99)
Potassium: 4 mEq/L (ref 3.5–5.1)
Sodium: 138 mEq/L (ref 135–145)

## 2011-12-12 LAB — MAGNESIUM: Magnesium: 2.4 mg/dL (ref 1.5–2.5)

## 2011-12-12 MED ORDER — TUBERCULIN PPD 5 UNIT/0.1ML ID SOLN
5.0000 [IU] | Freq: Once | INTRADERMAL | Status: AC
  Administered 2011-12-12: 5 [IU] via INTRADERMAL

## 2011-12-12 MED ORDER — POLYETHYLENE GLYCOL 3350 17 G PO PACK
17.0000 g | PACK | Freq: Two times a day (BID) | ORAL | Status: DC
  Administered 2011-12-12 – 2011-12-18 (×13): 17 g via ORAL
  Filled 2011-12-12 (×27): qty 1

## 2011-12-12 MED ORDER — SIMETHICONE 80 MG PO CHEW
80.0000 mg | CHEWABLE_TABLET | Freq: Four times a day (QID) | ORAL | Status: DC
  Administered 2011-12-12 – 2011-12-25 (×51): 80 mg via ORAL
  Filled 2011-12-12 (×63): qty 1

## 2011-12-12 NOTE — Progress Notes (Signed)
Pt c/o constipation and abdominal discomfort throughout the day. Pt reports that she was seeing bugs crawling on the walls, walls moving and floor coming up to her face during the night. She denies at this time. Offered support, and gave soapsuds enema as ordered. Pt tolerated well. Pt had BM following enema and continues to report  abdominal discomfort. Reported to MD and pt is being followed by internal medicine. Pt requesting to get insurance card and numbers out of locker. Per MHT, pt has been to locker multiple times during this admission. Called CM and reported to pt that CM had insurance information for follow up. Safety maintained on unit.

## 2011-12-12 NOTE — Progress Notes (Signed)
Pt continues to complain about constipation. Pt also wants to go in looker for numbers and doctors names to fill out a packet but it was told to her by RN to talk with case worker and get them to help her fill out the forms. Pt was offered support and encouragement. Pt receptive to treatment and safety maintained on the unit.

## 2011-12-12 NOTE — Progress Notes (Signed)
Subjective: Moved bowels, following enema this AM, but still has bloating and abdominal discomfort.  Objective: Vital signs in last 24 hours: Temp:  [97.8 F (36.6 C)] 97.8 F (36.6 C) (06/05 0809) Pulse Rate:  [92-99] 99  (06/05 0810) Resp:  [18] 18  (06/05 0809) BP: (99-100)/(67-68) 100/68 mmHg (06/05 0810) Weight change:  Last BM Date: 12/10/11  Intake/Output from previous day:       Physical Exam: General: Comfortable, alert, communicative, fully oriented, not short of breath at rest.  HEENT:  No clinical pallor, no jaundice, no conjunctival injection or discharge. Hydration is fair. NECK:  Supple, JVP not seen, no carotid bruits, no palpable lymphadenopathy, no palpable goiter. CHEST:  Clinically clear to auscultation, no wheezes, no crackles. HEART:  Sounds 1 and 2 heard, normal, regular, no murmurs. ABDOMEN:  Full, soft, non-tender. GENITALIA:  Not examined. LOWER EXTREMITIES:  No pitting edema, palpable peripheral pulses. MUSCULOSKELETAL SYSTEM:  Generalized osteoarthritic changes, otherwise, normal. CENTRAL NERVOUS SYSTEM:  No focal neurologic deficit on gross examination.  Lab Results: No results found for this basename: WBC:2,HGB:2,HCT:2,PLT:2 in the last 72 hours  Basename 12/12/11 0627  NA 138  K 4.0  CL 101  CO2 30  GLUCOSE 76  BUN 17  CREATININE 0.79  CALCIUM 9.0   Recent Results (from the past 240 hour(s))  WET PREP, GENITAL     Status: Abnormal   Collection Time   12/02/11  4:20 PM      Component Value Range Status Comment   Yeast Wet Prep HPF POC NONE SEEN  NONE SEEN  Final    Trich, Wet Prep NONE SEEN  NONE SEEN  Final    Clue Cells Wet Prep HPF POC FEW (*) NONE SEEN  Final    WBC, Wet Prep HPF POC FEW (*) NONE SEEN  Final      Studies/Results: Dg Abd 2 Views  January 02, 2012  *RADIOLOGY REPORT*  Clinical Data: Abdominal pain and bloating.  ABDOMEN - 2 VIEW  Comparison: None.  Findings: There is no free intraperitoneal air. No evidence of bowel  obstruction. Large stool burden noted.  IMPRESSION: No acute finding.  Large stool burden noted.  Original Report Authenticated By: Bernadene Bell. Maricela Curet, M.D.    Medications: Scheduled Meds:   . cephALEXin  500 mg Oral TID WC & HS  . efavirenz-emtrictabine-tenofovir  1 tablet Oral QHS  . fluocinonide  1 application Topical BID  . gabapentin  400 mg Oral [id number]  . hydrochlorothiazide  25 mg Oral Daily  . magnesium citrate  1 Bottle Oral Once  . magnesium citrate  1 Bottle Oral Once  . mulitivitamin with minerals  1 tablet Oral Daily  . polyethylene glycol  17 g Oral Daily  . QUEtiapine  1,000 mg Oral QHS  . sertraline  150 mg Oral Daily  . simvastatin  10 mg Oral q1800  . thiamine  100 mg Oral Daily  . tretinoin  1 application Topical QHS  . tuberculin  5 Units Intradermal Once  . valACYclovir  1,000 mg Oral Daily   Continuous Infusions:  PRN Meds:.acetaminophen, alum & mag hydroxide-simeth, bisacodyl, camphor-menthol, diphenhydrAMINE, magnesium hydroxide, ondansetron, sodium chloride, sodium phosphate, traZODone  Assessment/Plan:  Patient Active Hospital Problem List:  1. Constipation (01-02-12): Enema producing poor results. Patient is on Seroquel which may be contributory. Will change Miralax to b.i.d. Add Simethicone, for bloating. 2. Healed Skin lesions: Patient reportedly had folliculitis in the upper extremities, that has been satisfactorily treated by the Psychiatrist, with  keflex. At present, there are no active skin lesions.  3. HIV: On ART. Stable.  4. Psychiatrivc issues: Per primary.   LOS: 10 days   Madailein Londo,CHRISTOPHER 12/12/2011, 4:15 PM

## 2011-12-12 NOTE — Progress Notes (Signed)
Pt resting in bed with eyes closed.  No distress observed.  Trazodone had been given earlier to aid in initiating sleep.  Safety maintained with q15 minute checks.

## 2011-12-12 NOTE — Tx Team (Signed)
Interdisciplinary Treatment Plan Update (Adult)  Date:  12/12/2011  Time Reviewed:  10:15AM-11:15AM  Progress in Treatment: Attending groups:  Seldom Participating in groups:    No, does not want to be there Taking medication as prescribed:    Yes Tolerating medication:   Yes Family/Significant other contact made:  With her mentor Rev. Douglas Patient understands diagnosis:   Yes, poor insight, poor judgment Discussing patient identified problems/goals with staff:   Yes Medical problems stabilized or resolved:   No, impacted bowels Denies suicidal/homicidal ideation:  Passive SI, unless discharged, then will become active, will walk in front of a train or truck Issues/concerns per patient self-inventory:   None Other:    New problem(s) identified: Yes, Describe:  Constant complaint of stomach pain, bowel impaction, needs ordered enema to be done today  Reason for Continuation of Hospitalization: Anxiety Hallucinations Medication stabilization Suicidal ideation Other; describe hopelessness, not sleeping, placement into RTF  Interventions implemented related to continuation of hospitalization:  Medication monitoring and adjustment, safety checks Q15 min., suicide risk assessment, group therapy, psychoeducation, collateral contact, aftercare planning, ongoing physician assessments, medication education  Additional comments:  Not applicable  Estimated length of stay:  2-3 days  Discharge Plan:  Applying to [facility] in [facility], will also refer to [facility] for IVC commitment if does not complete application as is supposed to do  New goal(s):  Not applicable  Review of initial/current patient goals per problem list:   1.  Goal(s):  Deny SI for 48 hours prior to D/C.  Met:  No  Target date:  By Discharge   As evidenced by:  Passive SI at times, also continues to tell us that if we discharge her without sending her to rehab, she will walk in front of a vehicle  2.   Goal(s):  Decide if & how to address substance abuse issues at discharge.  Met:  No  Target date:  By Discharge   As evidenced by:  Patient not decided about where to go, has not completed application to [facility] over the last 5 days.  We will refer to [facility] under IVC if needed.  3.  Goal(s):  Reduce auditory and visual hallucinations to baseline per patient and collateral report.  Met:  No  Target date:  By Discharge   As evidenced by:  Still having visual hallucinations of bugs, no auditory hallucinations  4.  Goal(s):  Reduce depression to no greater than 3 at discharge.  Met:  No  Target date:  By Discharge   As evidenced by:  "10" today along with "10" for both anxiety and hopelessness  Attendees: Patient:     Family:     Physician:  Dr. Harvie Heck Readling 12/12/2011 10:15AM-11:15AM  Nursing:   Waynetta Sandy, RN 12/12/2011 10:15AM -11:15AM   Case Manager:  Ambrose Mantle, LCSW 12/12/2011 10:15AM-11:15AM  Counselor:  Veto Kemps, MT-BC 12/12/2011 10:15AM-11:15AM  Other:     Other:      Other:      Other:       Scribe for Treatment Team:   Sarina Ser, 12/12/2011, 10:15AM-11:15AM

## 2011-12-12 NOTE — Discharge Planning (Signed)
Met with patient in Aftercare Planning Group, which she attended only under pressure.  She then left early with Case Manager permission due to a headache and not having her glasses with her.  She kept asking throughout the day if Case Manager had done things that she had not asked Case Manager to do.  She wanted to get into her locker for phone numbers, and apparently over the weekend was in her locker numerous times.  Case Manager found out from her what telephone numbers she wanted, looked them up, and provided to her.  She apparently has a [employer] banker, corporate in addition to the one at [facility], and she has a Veterinary surgeon at [employer] [facility] the [employer] in [place].  Provided her with paper to write her autobiography, and enough paper to revise it several times.  Patient still had not complete the application to [facility].  Case Manager answered several questions, and she committed to completing it.  She initially said that doing the application had made her so sick she had to go to the bathroom.    Ambrose Mantle, LCSW 12/12/11  3:35pm

## 2011-12-12 NOTE — Progress Notes (Signed)
[facility] Group Notes:  (Counselor/Nursing/MHT/Case Management/Adjunct)  12/12/2011 1:24 PM  Type of Therapy:  Group Therapy  Participation Level:  Did Not Attend   Veto Kemps, MT-BC  12/12/2011, 1:24 PM

## 2011-12-12 NOTE — Progress Notes (Signed)
12/12/2011         Time: 0930       Group Topic/Focus: The focus of this group is on discussing various styles of communication and communicating assertively using 'I' (feeling) statements.  Participation Level: Active  Participation Quality: Redirectable and Resistant  Affect: Labile  Cognitive: Oriented  Additional Comments: Patient irritable at first, reports she is not attending groups because she doesn't feel well. Patient came to group with encouragement, brightened appropriately.  Andreal Vultaggio 12/12/2011 1:28 PM

## 2011-12-12 NOTE — Progress Notes (Signed)
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  Alcohol Dependence.  Cocaine Abuse.  Cannabis Abuse.   The patient was seen today and reports the following:   ADL's: Intact.  Sleep: The patient reports to having ongoing difficulty sleeping at night.  Appetite: The patient reports a "so so" appetite today.   Mild>(1-10) >Severe  Hopelessness (1-10): 10  Depression (1-10): 10  Anxiety (1-10): 10   Suicidal Ideation: The patient denies any suicidal ideations today.  Plan: No  Intent: No  Means: No   Homicidal Ideation: The patient denies any homicidal ideations today.  Plan: No  Intent: No.  Means: No   General Appearance/Behavior: The patient remains cooperative today with this provider.  Eye Contact: Good.  Speech: Appropriate in rate and volume with no pressuring noted today.  Motor Behavior: wnl.  Level of Consciousness: Alert and Oriented x 3.  Mental Status: Alert and Oriented x 3.  Mood: Severely depressed today.  Affect: Moderately constricted.  Anxiety Level: Severe anxiety reported today.  Thought Process: wnl.  Thought Content: The patient denies any auditory or visual hallucinations today as well as any delusional thinking.  Perception:. wnl.  Judgment: Fair.  Insight: Fair.  Cognition: Oriented to person, place and time.  Sleep:  Number of Hours: 4.25    Vital Signs:Blood pressure 100/68, pulse 99, temperature 97.8 F (36.6 C), temperature source Oral, resp. rate 18, height 5\' 4"  (1.626 m), weight 78.019 kg (172 lb).  Current Medications: Current Facility-Administered Medications  Medication Dose Route Frequency Provider Last Rate Last Dose  . acetaminophen (TYLENOL) tablet 650 mg  650 mg Oral Q6H PRN Nehemiah Settle, MD   650 mg at 12/12/11 0751  . alum & mag hydroxide-simeth (MAALOX/MYLANTA) 200-200-20 MG/5ML suspension 30 mL  30 mL Oral Q4H PRN Nehemiah Settle, MD        . bisacodyl (DULCOLAX) EC tablet 5 mg  5 mg Oral Daily PRN Mickie D. Adams, PA   5 mg at 12/11/11 0847  . camphor-menthol (SARNA) lotion   Topical PRN Wonda Cerise, MD      . cephALEXin (KEFLEX) capsule 500 mg  500 mg Oral TID WC & HS Curlene Labrum Shanautica Forker, MD   500 mg at 12/12/11 1249  . diphenhydrAMINE (BENADRYL) capsule 50 mg  50 mg Oral Q6H PRN Verne Spurr, PA-C   50 mg at 12/10/11 2256  . efavirenz-emtrictabine-tenofovir (ATRIPLA) 600-200-300 MG per tablet 1 tablet  1 tablet Oral QHS Ronny Bacon, MD   1 tablet at 12/11/11 2209  . fluocinonide (LIDEX) 0.05 % external solution 1 application  1 application Topical BID Ronny Bacon, MD   1 application at 12/12/11 [phone]  . gabapentin (NEURONTIN) capsule 400 mg  400 mg Oral [id number] Curlene Labrum Braelen Sproule, MD   400 mg at 12/12/11 0745  . hydrochlorothiazide (HYDRODIURIL) tablet 25 mg  25 mg Oral Daily Curlene Labrum Josian Lanese, MD   25 mg at 12/12/11 0745  . magnesium citrate solution 1 Bottle  1 Bottle Oral Once Curlene Labrum Tobi Leinweber, MD      . magnesium citrate solution 1 Bottle  1 Bottle Oral Once Altha Harm, MD   1 Bottle at 12/11/11 2210  . magnesium hydroxide (MILK OF MAGNESIA) suspension 30 mL  30 mL Oral Daily PRN Nehemiah Settle, MD   30 mL at 12/12/11 0421  . mulitivitamin with minerals tablet 1 tablet  1 tablet  Oral Daily Ronny Bacon, MD   1 tablet at 12/12/11 0745  . ondansetron (ZOFRAN-ODT) disintegrating tablet 4 mg  4 mg Oral Q8H PRN Nehemiah Settle, MD   4 mg at 12/06/11 2340  . polyethylene glycol (MIRALAX / GLYCOLAX) packet 17 g  17 g Oral Daily Altha Harm, MD   17 g at 12/12/11 0745  . QUEtiapine (SEROQUEL XR) 24 hr tablet 1,000 mg  1,000 mg Oral QHS Curlene Labrum Salvadore Valvano, MD   1,000 mg at 12/11/11 2209  . sertraline (ZOLOFT) tablet 150 mg  150 mg Oral Daily Curlene Labrum Durward Matranga, MD   150 mg at 12/12/11 0746  . simvastatin (ZOCOR) tablet 10 mg  10 mg Oral q1800 Verne Spurr, PA-C   10 mg at 12/11/11 1800  .  sodium chloride (OCEAN) 0.65 % nasal spray 1 spray  1 spray Each Nare PRN Ronny Bacon, MD   1 spray at 12/07/11 1851  . sodium phosphate (FLEET) 7-19 GM/118ML enema 1 enema  1 enema Rectal BID PRN Viviann Spare, FNP   1 enema at 12/10/11 1352  . thiamine (VITAMIN B-1) tablet 100 mg  100 mg Oral Daily Alyson Kuroski-Mazzei, DO   100 mg at 12/12/11 0744  . traZODone (DESYREL) tablet 150 mg  150 mg Oral QHS PRN Wonda Cerise, MD   150 mg at 12/11/11 2354  . tretinoin (RETIN-A) 0.025 % cream 1 application  1 application Topical QHS Verne Spurr, PA-C   1 application at 12/09/11 2200  . valACYclovir (VALTREX) tablet 1,000 mg  1,000 mg Oral Daily Verne Spurr, PA-C   1,000 mg at 12/12/11 0744  . DISCONTD: fluocinonide (LIDEX) 0.05 % external solution 1 application  1 application Topical BID Ronny Bacon, MD       Lab Results:  Results for orders placed during the hospital encounter of 12/02/11 (from the past 48 hour(s))  BASIC METABOLIC PANEL     Status: Normal   Collection Time   12/12/11  6:27 AM      Component Value Range Comment   Sodium 138  135 - 145 (mEq/L)    Potassium 4.0  3.5 - 5.1 (mEq/L)    Chloride 101  96 - 112 (mEq/L)    CO2 30  19 - 32 (mEq/L)    Glucose, Bld 76  70 - 99 (mg/dL)    BUN 17  6 - 23 (mg/dL)    Creatinine, Ser 2.95  0.50 - 1.10 (mg/dL)    Calcium 9.0  8.4 - 10.5 (mg/dL)    GFR calc non Af Amer >90  >90 (mL/min)    GFR calc Af Amer >90  >90 (mL/min)   MAGNESIUM     Status: Normal   Collection Time   12/12/11  6:27 AM      Component Value Range Comment   Magnesium 2.4  1.5 - 2.5 (mg/dL)    Lab Results: No results found for this or any previous visit (from the past 48 hour(s)).   Review of Systems:  Neurological: The patient denies any headaches today. She denies any seizures or dizziness.  G.I.: The patient reports ongoing constipation today and G.I. Upset today.  Musculoskeletal: The patient denies any muscle or skeletal difficulties.  Skin: Numerous  insect bites on arms and legs - healing.   Abdominal X-ray:  December 11, 2011 Clinical Data: Abdominal pain and bloating.  ABDOMEN - 2 VIEW  Comparison: None.  Findings: There is no free intraperitoneal air. No  evidence of  bowel obstruction. Large stool burden noted.  IMPRESSION:  No acute finding. Large stool burden noted.   Original Report Authenticated By: Bernadene Bell. Maricela Curet, M.D.  Time was spent today discussing with the patient her current symptoms. The patient reports to having ongoing difficulty maintaining sleep at night. She reports a "so so" appetite and reports severe feelings of sadness, anhedonia and depressed mood. She also reports severe anxiety. She denies any suicidal or homicidal ideations today and denies any auditory or visual hallucinations or delusional thinking today.   The patient was evaluated by Internal Medicine yesterday who ordered Mag Citrate and a Soap Sud Enema for her constipation.  The Internist felt the skin lesions were healing and did not require intervention.  Treatment Plan Summary:  1. Daily contact with patient to assess and evaluate symptoms and progress in treatment.  2. Medication management  3. The patient will deny suicidal ideations or homicidal ideations for 48 hours prior to discharge and have a depression and anxiety rating of 3 or less. The patient will also deny any auditory or visual hallucinations or delusional thinking.  4. The patient will deny any symptoms of substance withdrawal at time of discharge.   Plan:  1. Will increase the medication Zoloft to 150 mgs po q am for depression and anxiety.  2. Will continue the medication Neurontin to 400 mgs po q am, 2 pm and hs for anxiety and mood stabilization.  3. Will continue the medication Seroquel XR 1000 mgs po qhs for sleep and mood stabilization.  4. Will continue the medication Trazodone 100 mgs po qhs - prn for sleep.  5. Will order a Medicine Consult to evaluate the patient's  multiple medical issues.  6. Will continue the patient on her non-psychiatric medications.  7. Laboratory studies reviewed.  8. Will continue to monitor.  9. Case Manager is continuing to work on discharge planning for the patient. 10. Internal Medicine is following the patient for constipation.  Carla Rashad 12/12/2011, 1:09 PM

## 2011-12-13 DIAGNOSIS — L738 Other specified follicular disorders: Secondary | ICD-10-CM

## 2011-12-13 DIAGNOSIS — L678 Other hair color and hair shaft abnormalities: Secondary | ICD-10-CM

## 2011-12-13 DIAGNOSIS — K5900 Constipation, unspecified: Secondary | ICD-10-CM

## 2011-12-13 LAB — BASIC METABOLIC PANEL
BUN: 20 mg/dL (ref 6–23)
CO2: 27 mEq/L (ref 19–32)
Calcium: 8.8 mg/dL (ref 8.4–10.5)
Chloride: 103 mEq/L (ref 96–112)
Creatinine, Ser: 0.83 mg/dL (ref 0.50–1.10)
GFR calc Af Amer: >90 mL/min (ref >90)
GFR calc non Af Amer: 83 mL/min — ABNORMAL LOW (ref >90)
Glucose, Bld: 102 mg/dL — ABNORMAL HIGH (ref 70–99)
Potassium: 4.1 mEq/L (ref 3.5–5.1)
Sodium: 138 mEq/L (ref 135–145)

## 2011-12-13 LAB — MAGNESIUM: Magnesium: 2 mg/dL (ref 1.5–2.5)

## 2011-12-13 MED ORDER — PERMETHRIN 5 % EX CREA
TOPICAL_CREAM | Freq: Once | CUTANEOUS | Status: AC
  Administered 2011-12-13: 14:00:00 via TOPICAL
  Filled 2011-12-13: qty 60

## 2011-12-13 MED ORDER — BISACODYL 10 MG RE SUPP
10.0000 mg | Freq: Every day | RECTAL | Status: DC
  Administered 2011-12-13 – 2011-12-25 (×5): 10 mg via RECTAL
  Filled 2011-12-13 (×16): qty 1

## 2011-12-13 NOTE — Progress Notes (Signed)
[facility] Group Notes:  (Counselor/Nursing/MHT/Case Management/Adjunct)  12/13/2011 5:00 PM  Type of Therapy:  Group Therapy  Participation Level:  Did Not Attend    Lacey Moses 12/13/2011, 5:00 PM

## 2011-12-13 NOTE — Progress Notes (Signed)
Patient ID: Lacey Moses, female   DOB: [birth year], 46 y.o.   MRN: [id number] Pt. Eyes closed, no distress, noted resp. Even unlabored. Staff will monitor q32min for safety.

## 2011-12-13 NOTE — Progress Notes (Signed)
Subjective: Had some bowel movements yesterday, although none today. Abdominal bloating is less.  Objective: Vital signs in last 24 hours: Temp:  [97.4 F (36.3 C)] 97.4 F (36.3 C) (06/06 0703) Pulse Rate:  [87-97] 97  (06/06 0704) Resp:  [16] 16  (06/06 0703) BP: (104-105)/(66-71) 104/66 mmHg (06/06 0704) Weight change:  Last BM Date: 12/10/11  Intake/Output from previous day:       Physical Exam: General: Comfortable, alert, communicative, fully oriented, not short of breath at rest.  HEENT:  No clinical pallor, no jaundice, no conjunctival injection or discharge. Hydration is fair. NECK:  Supple, JVP not seen, no carotid bruits, no palpable lymphadenopathy, no palpable goiter. CHEST:  Clinically clear to auscultation, no wheezes, no crackles. HEART:  Sounds 1 and 2 heard, normal, regular, no murmurs. ABDOMEN:  Full, soft, non-tender, no palpable organomegaly, no palpable masses, normal bowel sounds. GENITALIA:  Not examined. LOWER EXTREMITIES:  No pitting edema, palpable peripheral pulses. MUSCULOSKELETAL SYSTEM:  Generalized osteoarthritic changes, otherwise, normal. CENTRAL NERVOUS SYSTEM:  No focal neurologic deficit on gross examination.  Lab Results: No results found for this basename: WBC:2,HGB:2,HCT:2,PLT:2 in the last 72 hours  Basename 12/13/11 0610 12/12/11 0627  NA 138 138  K 4.1 4.0  CL 103 101  CO2 27 30  GLUCOSE 102* 76  BUN 20 17  CREATININE 0.83 0.79  CALCIUM 8.8 9.0   No results found for this or any previous visit (from the past 240 hour(s)).   Studies/Results: Dg Abd 2 Views  06-Jan-2012  *RADIOLOGY REPORT*  Clinical Data: Abdominal pain and bloating.  ABDOMEN - 2 VIEW  Comparison: None.  Findings: There is no free intraperitoneal air. No evidence of bowel obstruction. Large stool burden noted.  IMPRESSION: No acute finding.  Large stool burden noted.  Original Report Authenticated By: Bernadene Bell. Maricela Curet, M.D.    Medications: Scheduled  Meds:    . cephALEXin  500 mg Oral TID WC & HS  . efavirenz-emtrictabine-tenofovir  1 tablet Oral QHS  . fluocinonide  1 application Topical BID  . gabapentin  400 mg Oral [id number]  . hydrochlorothiazide  25 mg Oral Daily  . magnesium citrate  1 Bottle Oral Once  . multivitamin with minerals  1 tablet Oral Daily  . permethrin   Topical Once  . polyethylene glycol  17 g Oral BID  . QUEtiapine  1,000 mg Oral QHS  . sertraline  150 mg Oral Daily  . simethicone  80 mg Oral QID  . simvastatin  10 mg Oral q1800  . thiamine  100 mg Oral Daily  . tretinoin  1 application Topical QHS  . tuberculin  5 Units Intradermal Once  . valACYclovir  1,000 mg Oral Daily  . DISCONTD: polyethylene glycol  17 g Oral Daily   Continuous Infusions:  PRN Meds:.acetaminophen, alum & mag hydroxide-simeth, bisacodyl, camphor-menthol, diphenhydrAMINE, magnesium hydroxide, ondansetron, sodium chloride, sodium phosphate, traZODone  Assessment/Plan:  Patient Active Hospital Problem List:  1. Constipation (January 06, 2012): Enema producing poor results. Patient is on Seroquel which may be contributory. Now on Miralax b.i.d. Simethicone added on 12/12/11, for bloating. For dulcolax suppositories at bed time.  2. Healed Skin lesions: Patient reportedly had folliculitis in the upper extremities, that has been satisfactorily treated by the Psychiatrist, with keflex. At present, there are no active skin lesions.  3. HIV: On ART. Stable.  4. Psychiatrivc issues: Per primary.  Comment: Constipation appears chronic (per patient, it has been going on for months). I feel current bowel  regimen will work, given time, but patient appears impatient, and seems to want instant results. Medical team will sign off today. If constipation continues to be an issue, I suggest obtaining a GI consultation.   LOS: 11 days   Lacey Moses,CHRISTOPHER 12/13/2011, 2:38 PM

## 2011-12-13 NOTE — Progress Notes (Signed)
[facility] MD Progress Note  12/13/2011 3:15 PM  Diagnosis:  Axis I: Major Depressive Disorder - Recurrent - Severe.  Alcohol Dependence.  Cocaine Abuse.  Cannabis Abuse.   The patient was seen today and reports the following:   ADL's: Intact.  Sleep: The patient reports to having ongoing difficulty sleeping at night but according to staff she slept approximately 5.75 hours last night.  Appetite: The patient reports a good appetite today.   Mild>(1-10) >Severe  Hopelessness (1-10): 9  Depression (1-10): 9  Anxiety (1-10): 9   Suicidal Ideation: The patient denies any suicidal ideations today.  Plan: No  Intent: No  Means: No   Homicidal Ideation: The patient denies any homicidal ideations today.  Plan: No  Intent: No.  Means: No   General Appearance/Behavior: The patient remains cooperative today with this provider but demanding.  Eye Contact: Good.  Speech: Appropriate in rate and volume with no pressuring noted today.  Motor Behavior: wnl.  Level of Consciousness: Alert and Oriented x 3.  Mental Status: Alert and Oriented x 3.  Mood: Appears moderately depressed today.  Affect: Appears moderately constricted and angry.  Anxiety Level: Appears moderately anxiety today.  Thought Process: wnl.  Thought Content: The patient denies any auditory or visual hallucinations today as well as any delusional thinking.  Perception:. wnl.  Judgment: Fair.  Insight: Fair.  Cognition: Oriented to person, place and time.  Sleep:  Number of Hours: 5.75    Vital Signs:Blood pressure 104/66, pulse 97, temperature 97.4 F (36.3 C), temperature source Oral, resp. rate 16, height 5\' 4"  (1.626 m), weight 78.019 kg (172 lb).  Current Medications: Current Facility-Administered Medications  Medication Dose Route Frequency Provider Last Rate Last Dose  . acetaminophen (TYLENOL) tablet 650 mg  650 mg Oral Q6H PRN Nehemiah Settle, MD   650 mg at 12/13/11 0815  . alum & mag hydroxide-simeth  (MAALOX/MYLANTA) 200-200-20 MG/5ML suspension 30 mL  30 mL Oral Q4H PRN Nehemiah Settle, MD      . bisacodyl (DULCOLAX) suppository 10 mg  10 mg Rectal QHS Laveda Norman, MD      . camphor-menthol [facility]) lotion   Topical PRN Wonda Cerise, MD      . cephALEXin (KEFLEX) capsule 500 mg  500 mg Oral TID WC & HS Curlene Labrum Romel Dumond, MD   500 mg at 12/13/11 1152  . diphenhydrAMINE (BENADRYL) capsule 50 mg  50 mg Oral Q6H PRN Verne Spurr, PA-C   50 mg at 12/10/11 2256  . efavirenz-emtrictabine-tenofovir (ATRIPLA) 600-200-300 MG per tablet 1 tablet  1 tablet Oral QHS Ronny Bacon, MD   1 tablet at 12/12/11 2119  . fluocinonide (LIDEX) 0.05 % external solution 1 application  1 application Topical BID Ronny Bacon, MD   1 application at 12/13/11 [phone]  . gabapentin (NEURONTIN) capsule 400 mg  400 mg Oral [id number] Curlene Labrum Una Yeomans, MD   400 mg at 12/13/11 1353  . hydrochlorothiazide (HYDRODIURIL) tablet 25 mg  25 mg Oral Daily Curlene Labrum Treysen Sudbeck, MD   25 mg at 12/13/11 0811  . magnesium citrate solution 1 Bottle  1 Bottle Oral Once Curlene Labrum Treylon Henard, MD      . magnesium hydroxide (MILK OF MAGNESIA) suspension 30 mL  30 mL Oral Daily PRN Nehemiah Settle, MD   30 mL at 12/13/11 0458  . mulitivitamin with minerals tablet 1 tablet  1 tablet Oral Daily Ronny Bacon, MD   1 tablet at 12/13/11 0811  .  ondansetron (ZOFRAN-ODT) disintegrating tablet 4 mg  4 mg Oral Q8H PRN Nehemiah Settle, MD   4 mg at 12/12/11 1358  . permethrin (ELIMITE) 5 % cream   Topical Once Verne Spurr, PA-C      . polyethylene glycol (MIRALAX / GLYCOLAX) packet 17 g  17 g Oral BID Laveda Norman, MD   17 g at 12/13/11 1610  . QUEtiapine (SEROQUEL XR) 24 hr tablet 1,000 mg  1,000 mg Oral QHS Curlene Labrum Beulah Matusek, MD   1,000 mg at 12/12/11 2118  . sertraline (ZOLOFT) tablet 150 mg  150 mg Oral Daily Curlene Labrum Tayvon Culley, MD   150 mg at 12/13/11 0809  . simethicone (MYLICON) chewable tablet 80 mg  80 mg Oral QID Laveda Norman, MD   80 mg at 12/13/11 1152  . simvastatin (ZOCOR) tablet 10 mg  10 mg Oral q1800 Verne Spurr, PA-C   10 mg at 12/12/11 1710  . sodium chloride (OCEAN) 0.65 % nasal spray 1 spray  1 spray Each Nare PRN Ronny Bacon, MD   1 spray at 12/13/11 0831  . sodium phosphate (FLEET) 7-19 GM/118ML enema 1 enema  1 enema Rectal BID PRN Viviann Spare, FNP   1 enema at 12/10/11 1352  . thiamine (VITAMIN B-1) tablet 100 mg  100 mg Oral Daily Alyson Kuroski-Mazzei, DO   100 mg at 12/13/11 0811  . traZODone (DESYREL) tablet 150 mg  150 mg Oral QHS PRN Wonda Cerise, MD   150 mg at 12/11/11 2354  . tretinoin (RETIN-A) 0.025 % cream 1 application  1 application Topical QHS Verne Spurr, PA-C   1 application at 12/09/11 2200  . valACYclovir (VALTREX) tablet 1,000 mg  1,000 mg Oral Daily Verne Spurr, PA-C   1,000 mg at 12/13/11 0810  . DISCONTD: bisacodyl (DULCOLAX) EC tablet 5 mg  5 mg Oral Daily PRN Mickie D. Adams, PA   5 mg at 12/11/11 0847  . DISCONTD: polyethylene glycol (MIRALAX / GLYCOLAX) packet 17 g  17 g Oral Daily Altha Harm, MD   17 g at 12/12/11 0745   Lab Results:  Results for orders placed during the hospital encounter of 12/02/11 (from the past 48 hour(s))  BASIC METABOLIC PANEL     Status: Normal   Collection Time   12/12/11  6:27 AM      Component Value Range Comment   Sodium 138  135 - 145 (mEq/L)    Potassium 4.0  3.5 - 5.1 (mEq/L)    Chloride 101  96 - 112 (mEq/L)    CO2 30  19 - 32 (mEq/L)    Glucose, Bld 76  70 - 99 (mg/dL)    BUN 17  6 - 23 (mg/dL)    Creatinine, Ser 9.60  0.50 - 1.10 (mg/dL)    Calcium 9.0  8.4 - 10.5 (mg/dL)    GFR calc non Af Amer >90  >90 (mL/min)    GFR calc Af Amer >90  >90 (mL/min)   MAGNESIUM     Status: Normal   Collection Time   12/12/11  6:27 AM      Component Value Range Comment   Magnesium 2.4  1.5 - 2.5 (mg/dL)   BASIC METABOLIC PANEL     Status: Abnormal   Collection Time   12/13/11  6:10 AM      Component Value Range Comment    Sodium 138  135 - 145 (mEq/L)    Potassium 4.1  3.5 -  5.1 (mEq/L)    Chloride 103  96 - 112 (mEq/L)    CO2 27  19 - 32 (mEq/L)    Glucose, Bld 102 (*) 70 - 99 (mg/dL)    BUN 20  6 - 23 (mg/dL)    Creatinine, Ser 1.61  0.50 - 1.10 (mg/dL)    Calcium 8.8  8.4 - 10.5 (mg/dL)    GFR calc non Af Amer 83 (*) >90 (mL/min)    GFR calc Af Amer >90  >90 (mL/min)   MAGNESIUM     Status: Normal   Collection Time   12/13/11  6:10 AM      Component Value Range Comment   Magnesium 2.0  1.5 - 2.5 (mg/dL)    Review of Systems:  Neurological: The patient denies any headaches today. She denies any seizures or dizziness.  G.I.: The patient reports ongoing constipation today and G.I. Upset today.  Musculoskeletal: The patient denies any muscle or skeletal difficulties.  Skin: Numerous insect bites on arms and legs - healing.   Abdominal X-ray: December 11, 2011  Clinical Data: Abdominal pain and bloating.  ABDOMEN - 2 VIEW  Comparison: None.  Findings: There is no free intraperitoneal air. No evidence of  bowel obstruction. Large stool burden noted.  IMPRESSION:  No acute finding. Large stool burden noted.  Original Report Authenticated By: Bernadene Bell. Maricela Curet, M.D.   Time was spent today discussing with the patient her current symptoms. The patient reports to having ongoing difficulty maintaining sleep at night. She reports a good appetite and is exhibiting moderate feelings of sadness, anhedonia and depressed mood. She also is exhibiting moderate anxiety. She denies any suicidal or homicidal ideations today and denies any auditory or visual hallucinations or delusional thinking today.    The patient reports to being angry that her constipation and skin issues are not being adequately addressed.  She was reminded that numerous interventions have been tried to address her constipation and Internal Medicine is now following her non-psychiatric medical issues including her skin concerns.  Treatment Plan  Summary:  1. Daily contact with patient to assess and evaluate symptoms and progress in treatment.  2. Medication management  3. The patient will deny suicidal ideations or homicidal ideations for 48 hours prior to discharge and have a depression and anxiety rating of 3 or less. The patient will also deny any auditory or visual hallucinations or delusional thinking.  4. The patient will deny any symptoms of substance withdrawal at time of discharge.   Plan:  1. Will continue the medication Zoloft at 150 mgs po q am for depression and anxiety.  2. Will continue the medication Neurontin to 400 mgs po q am, 2 pm and hs for anxiety and mood stabilization.  3. Will continue the medication Seroquel XR 1000 mgs po qhs for sleep and mood stabilization.  4. Will continue the medication Trazodone 100 mgs po qhs - prn for sleep.  5. Internal Medicine will continue to manage the patient's multiple medical issues.  6. Will continue the patient on her non-psychiatric medications.  7. Laboratory studies reviewed.  8. Will continue to monitor.  9. Case Manager is continuing to work on discharge planning for the patient.   Lacey Moses 12/13/2011, 3:15 PM

## 2011-12-13 NOTE — Progress Notes (Signed)
Pt rates depression as a 9 with passive si. She c/o itching, pain in abdomen and ankles and a dry nose. Offered support and gave prn medication for each complaint. Pt c/o constipation. Per call from internal medicine this morning continue to give scheduled medications. No pitting edema noted, MD aware. Safety maintained on unit.

## 2011-12-13 NOTE — Progress Notes (Signed)
Patient ID: Lacey Moses, female   DOB: 22-Oct-1965, 46 y.o.   MRN: [id number]   Subjective:  Pt. Complained of extreme constipation again today, and currently has multiple medications ordered.  She has milk of magnesia, dulcolax, magnesium citrate, miralax as well as a Fleets enema yesterday.  Suesan states she had some relief yesterday but none today.  She is not passing any flatus.  Her abdominal film notes a large stool burden, but no obstruction is noted. Arita is still eating and drinking well, denies nausea or vomiting.  Objective:  WDWN AA female, NAD.  Abdomen is distended but soft, soft minimal + bowel sounds x 4 quads, mild tenderness through out. No masses, no HSO, no rebound, no guarding.  Assessment: constipation with large stool burden. No obstruction noted.  Plan: 1. Pt. Is reassured.  Will give hot to liquids by mouth 8 oz q hour prn until bowel movement. 2. Can also give dulcolax suppository if no relief by hs.  Rona Ravens. Karma Ansley PAC
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
## 2011-12-14 MED ORDER — LORAZEPAM 1 MG PO TABS
1.0000 mg | ORAL_TABLET | ORAL | Status: AC
  Administered 2011-12-14: 1 mg via ORAL
  Filled 2011-12-14: qty 1

## 2011-12-14 MED ORDER — LORAZEPAM 1 MG PO TABS
1.0000 mg | ORAL_TABLET | Freq: Four times a day (QID) | ORAL | Status: DC | PRN
  Administered 2011-12-14 – 2011-12-15 (×2): 1 mg via ORAL
  Filled 2011-12-14: qty 1

## 2011-12-14 MED ORDER — LORAZEPAM 1 MG PO TABS
ORAL_TABLET | ORAL | Status: AC
  Filled 2011-12-14: qty 1

## 2011-12-14 MED ORDER — HALOPERIDOL 5 MG PO TABS
5.0000 mg | ORAL_TABLET | ORAL | Status: AC
  Administered 2011-12-14: 5 mg via ORAL
  Filled 2011-12-14 (×2): qty 1

## 2011-12-14 MED ORDER — HALOPERIDOL 5 MG PO TABS
ORAL_TABLET | ORAL | Status: AC
  Filled 2011-12-14: qty 1

## 2011-12-14 MED ORDER — HALOPERIDOL 5 MG PO TABS
5.0000 mg | ORAL_TABLET | Freq: Four times a day (QID) | ORAL | Status: DC | PRN
  Administered 2011-12-14: 5 mg via ORAL

## 2011-12-14 NOTE — Progress Notes (Signed)
[facility] Group Notes:  (Counselor/Nursing/MHT/Case Management/Adjunct)  12/14/2011 6:11 PM  Type of Therapy:  Group Therapy at 11 and 1:15  Participation Level:  Did Not Attend   Lacey Moses 12/14/2011, 6:11 PM

## 2011-12-14 NOTE — Progress Notes (Signed)
[facility] MD Progress Note  12/14/2011 7:44 PM  Diagnosis:  Axis I: Major Depressive Disorder - Recurrent - Severe.  Alcohol Dependence.  Cocaine Abuse.  Cannabis Abuse.   The patient was seen today and reports the following:   ADL's: Intact.  Sleep: The patient reports to sleeping "better" last night.  Appetite: The patient reports a good appetite today.   Mild>(1-10) >Severe  Hopelessness (1-10): 10  Depression (1-10): 10  Anxiety (1-10): 9   Suicidal Ideation: The patient denies any suicidal ideations today.  Plan: No  Intent: No  Means: No   Homicidal Ideation: The patient denies any homicidal ideations today.  Plan: No  Intent: No.  Means: No   General Appearance/Behavior: The patient remains cooperative today with this provider but continue to be demanding.  Eye Contact: Good.  Speech: Appropriate in rate and volume with no pressuring noted today.  Motor Behavior: wnl.  Level of Consciousness: Alert and Oriented x 3.  Mental Status: Alert and Oriented x 3.  Mood: Appears moderately depressed today.  Affect: Appears moderately constricted and angry.  Anxiety Level: Appears moderately angry today.  Thought Process: wnl.  Thought Content: The patient denies any auditory or visual hallucinations today as well as any delusional thinking.  Perception:. wnl.  Judgment: Fair.  Insight: Poor.  Cognition: Oriented to person, place and time.  Sleep:  Number of Hours: 3.75    Vital Signs:Blood pressure 128/88, pulse 96, temperature 97.8 F (36.6 C), temperature source Oral, resp. rate 18, height 5\' 4"  (1.626 m), weight 78.019 kg (172 lb).  Current Medications: Current Facility-Administered Medications  Medication Dose Route Frequency Provider Last Rate Last Dose  . acetaminophen (TYLENOL) tablet 650 mg  650 mg Oral Q6H PRN Nehemiah Settle, MD   650 mg at 12/13/11 0815  . alum & mag hydroxide-simeth (MAALOX/MYLANTA) 200-200-20 MG/5ML suspension 30 mL  30 mL Oral Q4H  PRN Nehemiah Settle, MD      . bisacodyl (DULCOLAX) suppository 10 mg  10 mg Rectal QHS Laveda Norman, MD   10 mg at 12/14/11 1248  . camphor-menthol (SARNA) lotion   Topical PRN Wonda Cerise, MD      . cephALEXin (KEFLEX) capsule 500 mg  500 mg Oral TID WC & HS Curlene Labrum Yittel Emrich, MD   500 mg at 12/14/11 1142  . diphenhydrAMINE (BENADRYL) capsule 50 mg  50 mg Oral Q6H PRN Verne Spurr, PA-C   50 mg at 12/10/11 2256  . efavirenz-emtrictabine-tenofovir (ATRIPLA) 600-200-300 MG per tablet 1 tablet  1 tablet Oral QHS Ronny Bacon, MD   1 tablet at 12/13/11 2149  . fluocinonide (LIDEX) 0.05 % external solution 1 application  1 application Topical BID Ronny Bacon, MD   1 application at 12/14/11 1654  . gabapentin (NEURONTIN) capsule 400 mg  400 mg Oral [id number] Curlene Labrum Gerene Nedd, MD   400 mg at 12/14/11 1247  . haloperidol (HALDOL) tablet 5 mg  5 mg Oral Q6H PRN Curlene Labrum Zayde Stroupe, MD   5 mg at 12/14/11 1458  . haloperidol (HALDOL) tablet 5 mg  5 mg Oral NOW Curlene Labrum Israella Hubert, MD   5 mg at 12/14/11 1700  . hydrochlorothiazide (HYDRODIURIL) tablet 25 mg  25 mg Oral Daily Curlene Labrum Seirra Kos, MD   25 mg at 12/14/11 0805  . LORazepam (ATIVAN) tablet 1 mg  1 mg Oral Q6H PRN Ronny Bacon, MD   1 mg at 12/14/11 1458  . LORazepam (ATIVAN) tablet 1 mg  1 mg Oral NOW Curlene Labrum Marilyn Wing, MD   1 mg at 12/14/11 1701  . magnesium citrate solution 1 Bottle  1 Bottle Oral Once Curlene Labrum Gary Bultman, MD      . magnesium hydroxide (MILK OF MAGNESIA) suspension 30 mL  30 mL Oral Daily PRN Nehemiah Settle, MD   30 mL at 12/13/11 0458  . mulitivitamin with minerals tablet 1 tablet  1 tablet Oral Daily Ronny Bacon, MD   1 tablet at 12/14/11 0804  . ondansetron (ZOFRAN-ODT) disintegrating tablet 4 mg  4 mg Oral Q8H PRN Nehemiah Settle, MD   4 mg at 12/14/11 0410  . polyethylene glycol (MIRALAX / GLYCOLAX) packet 17 g  17 g Oral BID Laveda Norman, MD   17 g at 12/14/11 1654  . QUEtiapine (SEROQUEL  XR) 24 hr tablet 1,000 mg  1,000 mg Oral QHS Curlene Labrum Kateria Cutrona, MD   1,000 mg at 12/13/11 2149  . sertraline (ZOLOFT) tablet 150 mg  150 mg Oral Daily Curlene Labrum Sheyli Horwitz, MD   150 mg at 12/14/11 0804  . simethicone (MYLICON) chewable tablet 80 mg  80 mg Oral QID Laveda Norman, MD   80 mg at 12/14/11 1655  . simvastatin (ZOCOR) tablet 10 mg  10 mg Oral q1800 Verne Spurr, PA-C   10 mg at 12/14/11 1655  . sodium chloride (OCEAN) 0.65 % nasal spray 1 spray  1 spray Each Nare PRN Ronny Bacon, MD   1 spray at 12/14/11 0805  . sodium phosphate (FLEET) 7-19 GM/118ML enema 1 enema  1 enema Rectal BID PRN Viviann Spare, FNP   1 enema at 12/10/11 1352  . thiamine (VITAMIN B-1) tablet 100 mg  100 mg Oral Daily Alyson Kuroski-Mazzei, DO   100 mg at 12/14/11 0804  . traZODone (DESYREL) tablet 150 mg  150 mg Oral QHS PRN Wonda Cerise, MD   150 mg at 12/11/11 2354  . tretinoin (RETIN-A) 0.025 % cream 1 application  1 application Topical QHS Verne Spurr, PA-C   1 application at 12/09/11 2200  . valACYclovir (VALTREX) tablet 1,000 mg  1,000 mg Oral Daily Verne Spurr, PA-C   1,000 mg at 12/14/11 0805   Lab Results:  Results for orders placed during the hospital encounter of 12/02/11 (from the past 48 hour(s))  BASIC METABOLIC PANEL     Status: Abnormal   Collection Time   12/13/11  6:10 AM      Component Value Range Comment   Sodium 138  135 - 145 (mEq/L)    Potassium 4.1  3.5 - 5.1 (mEq/L)    Chloride 103  96 - 112 (mEq/L)    CO2 27  19 - 32 (mEq/L)    Glucose, Bld 102 (*) 70 - 99 (mg/dL)    BUN 20  6 - 23 (mg/dL)    Creatinine, Ser 0.98  0.50 - 1.10 (mg/dL)    Calcium 8.8  8.4 - 10.5 (mg/dL)    GFR calc non Af Amer 83 (*) >90 (mL/min)    GFR calc Af Amer >90  >90 (mL/min)   MAGNESIUM     Status: Normal   Collection Time   12/13/11  6:10 AM      Component Value Range Comment   Magnesium 2.0  1.5 - 2.5 (mg/dL)    Review of Systems:  Neurological: The patient denies any headaches today. She denies  any seizures or dizziness.  G.I.: The patient reports ongoing constipation today and  G.I. Upset today.  Musculoskeletal: The patient denies any muscle or skeletal difficulties.  Skin: Numerous insect bites on arms and legs - healing.   Abdominal X-ray: December 11, 2011  Clinical Data: Abdominal pain and bloating.  ABDOMEN - 2 VIEW  Comparison: None.  Findings: There is no free intraperitoneal air. No evidence of  bowel obstruction. Large stool burden noted.   IMPRESSION:  No acute finding. Large stool burden noted.  Original Report Authenticated By: Bernadene Bell. Maricela Curet, M.D.   Time was spent today discussing with the patient her current symptoms. The patient reports to sleeping reasonably well last night. She reports a good appetite and is exhibiting moderate feelings of sadness, anhedonia and depressed mood. She also is exhibiting moderate anger. She denies any suicidal or homicidal ideations today and denies any auditory or visual hallucinations or delusional thinking today.   The patient had less somatic concerns today but was angry about various rules on the unit which she states is unfair.  It was also discussed with the patient that she could not enter the 2 year treatment program she is applying due to her criminal record.   Treatment Plan Summary:  1. Daily contact with patient to assess and evaluate symptoms and progress in treatment.  2. Medication management  3. The patient will deny suicidal ideations or homicidal ideations for 48 hours prior to discharge and have a depression and anxiety rating of 3 or less. The patient will also deny any auditory or visual hallucinations or delusional thinking.  4. The patient will deny any symptoms of substance withdrawal at time of discharge.   Plan:  1. Will continue the medication Zoloft at 150 mgs po q am for depression and anxiety.  2. Will continue the medication Neurontin to 400 mgs po q am, 2 pm and hs for anxiety and mood  stabilization.  3. Will continue the medication Seroquel XR 1000 mgs po qhs for sleep and mood stabilization.  4. Will continue the medication Trazodone 100 mgs po qhs - prn for sleep.  5. Internal Medicine will continue to manage the patient's multiple medical issues.  6. Will continue the patient on her non-psychiatric medications.  7. Laboratory studies reviewed.  8. Will continue to monitor.  9. Case Manager is continuing to work on discharge planning for the patient.  Current [facility] appears to be the best option.  Halimah Bewick 12/14/2011, 7:44 PM

## 2011-12-14 NOTE — Progress Notes (Signed)
12/14/2011         Time: 0930      Group Topic/Focus: The focus of the group is on enhancing the patients' ability to cope with stressors by understanding what coping is, why it is important, the negative effects of stress and developing healthier coping skills. Patients practice [place] and discuss how exercise can be used as a healthy coping strategy.  Participation Level: Minimal  Participation Quality: Resistant  Affect: Irritable  Cognitive: Oriented  Additional Comments: Patient continues to complain of stomach pain, refused to participate and left group. Patient reported she was going to use the restroom but was observed to be walking up and down the hall throughout group.     Fortunato Nordin 12/14/2011 1:16 PM

## 2011-12-14 NOTE — Progress Notes (Addendum)
Writer meet with pt after complaints of rectal bleeding. Pt showed writer bright red blood retrieved on toilet paper after wiping. Pt showed writer blood contained on tissue from three separate wipes. Wrier examined anal area. No external hemorrhoids noted or continuing bleeding noted. Md notified with instructions to cal back for increased bleeding and client education on refraining from straining. Information will be communicated to pt. Pt asleep after conclusion of conversation to physician. Writer did not disturb pt. Tech informed of plan and to be cautious of bleeding from pt as well due to current condition and positive status of pt. Pt remains safe with q49min checks.

## 2011-12-14 NOTE — Progress Notes (Signed)
Patient remains agitated, demanding and intrusive. Requesting medication for anxiety. Writer encouraged patient to utilize breathing exercises and guided imagery until order received. Joice Lofts RN MS EdS 12/14/2011  2:47 PM

## 2011-12-14 NOTE — Progress Notes (Signed)
Patient given Haldol and Ativan PRN for agitation and anxiety. Joice Lofts RN MS EdS 12/14/2011  3:10 PM
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
## 2011-12-14 NOTE — Progress Notes (Addendum)
Patient continues to endorse constipation. States that her abdomen is tight when she eats anything. Patient encouraged to speak with MD during morning rounds. Patient given warm apple juice to assist with constipation.

## 2011-12-14 NOTE — Progress Notes (Signed)
Patient upset and yelling because staff removed a brown paper sack with snacks from her room. Patient also upset because staff searched her belongings during environmental rounds and did not put things back exactly as she had them. Patient told that if she is hungry staff would get her a snack.  Joice Lofts RN MS EdS 12/14/2011  12:43 PM

## 2011-12-15 MED ORDER — QUETIAPINE FUMARATE ER 300 MG PO TB24
600.0000 mg | ORAL_TABLET | Freq: Every day | ORAL | Status: DC
  Administered 2011-12-15: 600 mg via ORAL
  Filled 2011-12-15 (×3): qty 2

## 2011-12-15 MED ORDER — PERMETHRIN 5 % EX CREA
TOPICAL_CREAM | Freq: Once | CUTANEOUS | Status: AC
  Administered 2011-12-15: 17:00:00 via TOPICAL
  Filled 2011-12-15 (×2): qty 60

## 2011-12-15 MED ORDER — RISPERIDONE 1 MG PO TABS
1.0000 mg | ORAL_TABLET | Freq: Every day | ORAL | Status: DC
  Administered 2011-12-15: 1 mg via ORAL
  Filled 2011-12-15 (×4): qty 1

## 2011-12-15 NOTE — Progress Notes (Signed)
Patient ID: Lacey Moses, female   DOB: 03-Jan-1966, 46 y.o.   MRN: [id number]   [facility] Notes:  (Counselor/Nursing/MHT/Case Management/Adjunct)  12/15/2011 11 AM  Type of Therapy:  Aftercare Planning, Group Therapy, Dance/Movement Therapy   Participation Level:  Minimal  Participation Quality:  Appropriate  Affect:  Flat  Cognitive:  Appropriate  Insight:  Limited  Engagement in Group:  Limited  Engagement in Therapy:  Limited  Modes of Intervention:  Clarification, Problem-solving, Role-play, Socialization and Support  Summary of Progress/Problems: After Care: Pt. attended and participated in aftercare planning group. Pt. verbally accepted information on suicide prevention, warning signs to look for with suicide and crisis line numbers to use. Pt. listed their current anxiety level and depression level as hight. Pt. shared she ws experiencing some pain.  Counseling: Therapist discussed supports and self-sabotage. Therapist used movement to model how sometimes we need to left go of negative supports and walk away. Pt. Left before counseling group began.     Cassidi Long

## 2011-12-15 NOTE — Progress Notes (Signed)
Pt is irritable and agitated  She continues to complain about no bowel movement to nurses and told the mht she needed clean sheets because she has had diarrhea all week  Pt is frequently at the nurses station asking over and over for medications   She seems to forget she has just asked recently for the same thing and she goes from person to person asking the same thing   Verbal support given  Addressed concerns about bowels with patient   Medications administered and effectiveness monitored   q 15 min checks   Pt safe at present

## 2011-12-15 NOTE — Progress Notes (Signed)
Patient ID: Lacey Moses, female   DOB: 10/04/1965, 46 y.o.   MRN: [id number] [facility] MD Progress Note  12/15/2011 11:56 AM  Diagnosis:  Axis I: Major Depressive Disorder - Recurrent - Severe.  Alcohol Dependence.  Cocaine Abuse.  Cannabis Abuse.   The patient was seen today and reports the following:   ADL's: Intact.  Sleep: The patient reports to sleeping "not good" last night.  Appetite: The patient reports a good appetite today.   Suicidal Ideation: The patient denies any suicidal ideations today.  Plan: No  Intent: No  Means: No   Homicidal Ideation: The patient denies any homicidal ideations today.  Plan: No  Intent: No.  Means: No   General Appearance/Behavior: The patient remains cooperative today with this provider but continues to be demanding.  Eye Contact: Good.  Speech: Appropriate in rate and volume with no pressuring noted today.  Motor Behavior: wnl.  Level of Consciousness: Alert and Oriented x 3.  Mental Status: Alert and Oriented x 3.  Mood: "not good" Affect: Appears moderately constricted and angry.  Thought Process: linear and goal-directed. Thought Content: The patient denies any auditory or visual hallucinations today as well as any delusional thinking. No SI/HI. Remains focused on somatic concerns such as constipation. Perception:. Not responding to internal stimuli during the interview. Judgment: Fair.  Insight: Poor.  Cognition: Oriented to person, place and time.  Sleep:  Number of Hours: 6.75    Vital Signs:Blood pressure 130/68, pulse 62, temperature 97.6 F (36.4 C), temperature source Oral, resp. rate 18, height 5\' 4"  (1.626 m), weight 172 lb (78.019 kg).  Current Medications: Current Facility-Administered Medications  Medication Dose Route Frequency Provider Last Rate Last Dose  . acetaminophen (TYLENOL) tablet 650 mg  650 mg Oral Q6H PRN Nehemiah Settle, MD   650 mg at 12/13/11 0815  . alum & mag hydroxide-simeth (MAALOX/MYLANTA)  200-200-20 MG/5ML suspension 30 mL  30 mL Oral Q4H PRN Nehemiah Settle, MD      . bisacodyl (DULCOLAX) suppository 10 mg  10 mg Rectal QHS Laveda Norman, MD   10 mg at 12/14/11 1248  . camphor-menthol (SARNA) lotion   Topical PRN Wonda Cerise, MD      . diphenhydrAMINE (BENADRYL) capsule 50 mg  50 mg Oral Q6H PRN Verne Spurr, PA-C   50 mg at 12/10/11 2256  . efavirenz-emtrictabine-tenofovir (ATRIPLA) 600-200-300 MG per tablet 1 tablet  1 tablet Oral QHS Ronny Bacon, MD   1 tablet at 12/14/11 2222  . fluocinonide (LIDEX) 0.05 % external solution 1 application  1 application Topical BID Ronny Bacon, MD   1 application at 12/15/11 0815  . gabapentin (NEURONTIN) capsule 400 mg  400 mg Oral [id number] Curlene Labrum Readling, MD   400 mg at 12/15/11 0816  . haloperidol (HALDOL) tablet 5 mg  5 mg Oral Q6H PRN Curlene Labrum Readling, MD   5 mg at 12/14/11 1458  . haloperidol (HALDOL) tablet 5 mg  5 mg Oral NOW Curlene Labrum Readling, MD   5 mg at 12/14/11 1700  . hydrochlorothiazide (HYDRODIURIL) tablet 25 mg  25 mg Oral Daily Curlene Labrum Readling, MD   25 mg at 12/15/11 0816  . LORazepam (ATIVAN) tablet 1 mg  1 mg Oral Q6H PRN Ronny Bacon, MD   1 mg at 12/14/11 1458  . LORazepam (ATIVAN) tablet 1 mg  1 mg Oral NOW Curlene Labrum Readling, MD   1 mg at 12/14/11 1701  . magnesium citrate  solution 1 Bottle  1 Bottle Oral Once Curlene Labrum Readling, MD      . magnesium hydroxide (MILK OF MAGNESIA) suspension 30 mL  30 mL Oral Daily PRN Nehemiah Settle, MD   30 mL at 12/13/11 0458  . mulitivitamin with minerals tablet 1 tablet  1 tablet Oral Daily Ronny Bacon, MD   1 tablet at 12/15/11 0816  . ondansetron (ZOFRAN-ODT) disintegrating tablet 4 mg  4 mg Oral Q8H PRN Nehemiah Settle, MD   4 mg at 12/14/11 0410  . polyethylene glycol (MIRALAX / GLYCOLAX) packet 17 g  17 g Oral BID Laveda Norman, MD   17 g at 12/15/11 0815  . QUEtiapine (SEROQUEL XR) 24 hr tablet 1,000 mg  1,000 mg Oral QHS Curlene Labrum  Readling, MD   1,000 mg at 12/14/11 2222  . sertraline (ZOLOFT) tablet 150 mg  150 mg Oral Daily Curlene Labrum Readling, MD   150 mg at 12/15/11 0815  . simethicone (MYLICON) chewable tablet 80 mg  80 mg Oral QID Laveda Norman, MD   80 mg at 12/15/11 0815  . simvastatin (ZOCOR) tablet 10 mg  10 mg Oral q1800 Verne Spurr, PA-C   10 mg at 12/14/11 1655  . sodium chloride (OCEAN) 0.65 % nasal spray 1 spray  1 spray Each Nare PRN Ronny Bacon, MD   1 spray at 12/14/11 0805  . sodium phosphate (FLEET) 7-19 GM/118ML enema 1 enema  1 enema Rectal BID PRN Viviann Spare, FNP   1 enema at 12/10/11 1352  . thiamine (VITAMIN B-1) tablet 100 mg  100 mg Oral Daily Alyson Kuroski-Mazzei, DO   100 mg at 12/15/11 0815  . traZODone (DESYREL) tablet 150 mg  150 mg Oral QHS PRN Wonda Cerise, MD   150 mg at 12/11/11 2354  . tretinoin (RETIN-A) 0.025 % cream 1 application  1 application Topical QHS Verne Spurr, PA-C   1 application at 12/09/11 2200  . valACYclovir (VALTREX) tablet 1,000 mg  1,000 mg Oral Daily Verne Spurr, PA-C   1,000 mg at 12/15/11 1610   Lab Results:  No results found for this or any previous visit (from the past 48 hour(s)). Review of Systems:  Neurological: The patient denies any headaches today. She denies any seizures or dizziness.  G.I.: The patient reports ongoing constipation today and G.I. Upset today.  Musculoskeletal: The patient denies any muscle or skeletal difficulties.  Skin: Numerous insect bites on arms and legs - healing. Patient complains of itching.  Abdominal X-ray: December 11, 2011  Clinical Data: Abdominal pain and bloating.  ABDOMEN - 2 VIEW  Comparison: None.  Findings: There is no free intraperitoneal air. No evidence of  bowel obstruction. Large stool burden noted.   IMPRESSION:  No acute finding. Large stool burden noted.  Original Report Authenticated By: Bernadene Bell. Maricela Curet, M.D.   Time was spent today discussing with the patient her current symptoms. The  patient reports to worse sleep last night. She is preoccupied with concerns about constipation and abdominal discomfort. She also complains about a health care tech messing up the way her clothes were organized, and about the housekeepers not cleaning appropriately, saying "they're just passing the day by." She asks if she can return to [facility] to have her constipation relieved.   Plan:  1. Continue psychiatric medications per primary team, with exception of reducing Seroquel XR dose to 600 mg given persistent problems with constipation (Seroquel is likely largely contributing). Will  cross-titrate with Risperdal (start at 1 mg po qhs tonight). 2. Internal Medicine will continue to manage the patient's multiple medical issues.  3. Will continue the patient on her non-psychiatric medications.  4. Laboratory studies reviewed.  5. Will continue to monitor.  6. Case Manager is continuing to work on discharge planning for the patient.  Currently [facility] appears to be the best option.  Eligah East 12/15/2011, 11:56 AM

## 2011-12-16 MED ORDER — RISPERIDONE 2 MG PO TABS
2.0000 mg | ORAL_TABLET | Freq: Every day | ORAL | Status: DC
  Administered 2011-12-16: 2 mg via ORAL
  Filled 2011-12-16 (×4): qty 1

## 2011-12-16 MED ORDER — QUETIAPINE FUMARATE ER 300 MG PO TB24
300.0000 mg | ORAL_TABLET | Freq: Every day | ORAL | Status: DC
  Administered 2011-12-16: 300 mg via ORAL
  Filled 2011-12-16 (×3): qty 1

## 2011-12-16 MED ORDER — PEG 3350-KCL-NA BICARB-NACL 420 G PO SOLR
4000.0000 mL | Freq: Once | ORAL | Status: AC
  Administered 2011-12-16: 4000 mL via ORAL
  Filled 2011-12-16: qty 4000

## 2011-12-16 NOTE — Progress Notes (Signed)
Pt has had several large loose stools over the past 3 hours   She completed the nulytely around 4pm  Will continue to monitor  Pt safe at present

## 2011-12-16 NOTE — Progress Notes (Signed)
Patient ID: Lacey Moses, female   DOB: Nov 27, 1965, 46 y.o.   MRN: [id number] Pt c/o anxiety; Ativan 1 mg prn given as ordered.

## 2011-12-16 NOTE — Progress Notes (Signed)
Pt continues to complain of constipation even though she is having stools from the laxatives she has received  She is less intrusive and irritable than yesterday  It was noted she is hoarding snacks in her room and eats frequently and large amounts   Verbal support given  Medications administered and effectiveness monitored  Q 15 min checks  Pt is presently drinking nulytely per recommendation of gastroenterologist md spoke with  Will continue to administer as ordered and monitor for results  Pt safe at present

## 2011-12-16 NOTE — Progress Notes (Signed)
Patient ID: Lacey Moses, female   DOB: 07-12-1965, 46 y.o.   MRN: [id number] Pt resting with eyes closed; med effective.

## 2011-12-16 NOTE — Progress Notes (Signed)
Patient ID: Lacey Moses, female   DOB: Apr 16, 1966, 46 y.o.   MRN: [id number]   [facility] Group Notes:  (Counselor/Nursing/MHT/Case Management/Adjunct)  12/16/2011 11 AM  Type of Therapy:  Aftercare Planning, Group Therapy, Dance/Movement Therapy   Participation Level:  Did Not Attend     Cassidi Long

## 2011-12-16 NOTE — Progress Notes (Signed)
Patient ID: Lacey Moses, female   DOB: 1966-06-07, 46 y.o.   MRN: [id number] Pt. Eyes closed, no distress noted, resp. Even, unlabored. Staff will monitor q58min for safety.

## 2011-12-16 NOTE — Progress Notes (Addendum)
Patient ID: Lacey Moses, female   DOB: [birth year], 46 y.o.   MRN: [id number] Patient ID: Lacey Moses, female   DOB: 01-26-1966, 46 y.o.   MRN: [id number] [facility] MD Progress Note  12/16/2011 11:06 AM  Diagnosis:  Axis I: Major Depressive Disorder - Recurrent - Severe.  Alcohol Dependence.  Cocaine Abuse.  Cannabis Abuse.   The patient was seen today and reports the following:   ADL's: Intact.  Sleep: The patient reports to sleeping very well last night Appetite: The patient reports a good appetite today.   Suicidal Ideation: The patient denies any suicidal ideations today.  Plan: No  Intent: No  Means: No   Homicidal Ideation: The patient denies any homicidal ideations today.  Plan: No  Intent: No.  Means: No   General Appearance/Behavior: The patient remains cooperative today with this provider. Casually clothed and groomed. Eye Contact: Good.  Speech: Appropriate in rate and volume with no pressuring noted today.  Motor Behavior: wnl.  Level of Consciousness: Alert and Oriented x 3.  Mental Status: Alert and Oriented x 3.  Mood: "ok" Affect: Appears constricted; less irritable today Thought Process: linear and goal-directed. Thought Content: The patient denies any auditory or visual hallucinations today as well as any delusional thinking. No SI/HI. Remains focused on somatic concerns such as constipation. Perception:. Not responding to internal stimuli during the interview. Judgment: Fair.  Insight: Poor.  Cognition: Oriented to person, place and time.  Sleep:  Number of Hours: 3.5    Vital Signs:Blood pressure 135/88, pulse 107, temperature 97.6 F (36.4 C), temperature source Oral, resp. rate 18, height 5\' 4"  (1.626 m), weight 172 lb (78.019 kg).  Current Medications: Current Facility-Administered Medications  Medication Dose Route Frequency Provider Last Rate Last Dose  . acetaminophen (TYLENOL) tablet 650 mg  650 mg Oral Q6H PRN Nehemiah Settle, MD   650 mg  at 12/13/11 0815  . alum & mag hydroxide-simeth (MAALOX/MYLANTA) 200-200-20 MG/5ML suspension 30 mL  30 mL Oral Q4H PRN Nehemiah Settle, MD      . bisacodyl (DULCOLAX) suppository 10 mg  10 mg Rectal QHS Laveda Norman, MD   10 mg at 12/15/11 2130  . camphor-menthol (SARNA) lotion   Topical PRN Wonda Cerise, MD      . diphenhydrAMINE (BENADRYL) capsule 50 mg  50 mg Oral Q6H PRN Verne Spurr, PA-C   50 mg at 12/15/11 1715  . efavirenz-emtrictabine-tenofovir (ATRIPLA) 600-200-300 MG per tablet 1 tablet  1 tablet Oral QHS Ronny Bacon, MD   1 tablet at 12/15/11 2134  . fluocinonide (LIDEX) 0.05 % external solution 1 application  1 application Topical BID Ronny Bacon, MD   1 application at 12/16/11 0557  . gabapentin (NEURONTIN) capsule 400 mg  400 mg Oral [id number] Curlene Labrum Readling, MD   400 mg at 12/16/11 0805  . haloperidol (HALDOL) tablet 5 mg  5 mg Oral Q6H PRN Ronny Bacon, MD   5 mg at 12/14/11 1458  . hydrochlorothiazide (HYDRODIURIL) tablet 25 mg  25 mg Oral Daily Curlene Labrum Readling, MD   25 mg at 12/16/11 0755  . LORazepam (ATIVAN) tablet 1 mg  1 mg Oral Q6H PRN Ronny Bacon, MD   1 mg at 12/15/11 2134  . magnesium citrate solution 1 Bottle  1 Bottle Oral Once Curlene Labrum Readling, MD      . magnesium hydroxide (MILK OF MAGNESIA) suspension 30 mL  30 mL Oral Daily PRN Nehemiah Settle, MD  30 mL at 12/16/11 0527  . mulitivitamin with minerals tablet 1 tablet  1 tablet Oral Daily Ronny Bacon, MD   1 tablet at 12/16/11 0754  . ondansetron (ZOFRAN-ODT) disintegrating tablet 4 mg  4 mg Oral Q8H PRN Nehemiah Settle, MD   4 mg at 12/14/11 0410  . permethrin (ELIMITE) 5 % cream   Topical Once Curlene Labrum Readling, MD      . polyethylene glycol (MIRALAX / GLYCOLAX) packet 17 g  17 g Oral BID Laveda Norman, MD   17 g at 12/16/11 0805  . QUEtiapine (SEROQUEL XR) 24 hr tablet 600 mg  600 mg Oral QHS Curlene Labrum Readling, MD   600 mg at 12/15/11 2133  . risperiDONE  (RISPERDAL) tablet 1 mg  1 mg Oral QHS Curlene Labrum Readling, MD   1 mg at 12/15/11 2134  . sertraline (ZOLOFT) tablet 150 mg  150 mg Oral Daily Curlene Labrum Readling, MD   150 mg at 12/16/11 0755  . simethicone (MYLICON) chewable tablet 80 mg  80 mg Oral QID Laveda Norman, MD   80 mg at 12/16/11 0755  . simvastatin (ZOCOR) tablet 10 mg  10 mg Oral q1800 Verne Spurr, PA-C   10 mg at 12/15/11 1713  . sodium chloride (OCEAN) 0.65 % nasal spray 1 spray  1 spray Each Nare PRN Ronny Bacon, MD   1 spray at 12/16/11 0754  . sodium phosphate (FLEET) 7-19 GM/118ML enema 1 enema  1 enema Rectal BID PRN Viviann Spare, FNP   1 enema at 12/10/11 1352  . thiamine (VITAMIN B-1) tablet 100 mg  100 mg Oral Daily Alyson Kuroski-Mazzei, DO   100 mg at 12/16/11 0755  . traZODone (DESYREL) tablet 150 mg  150 mg Oral QHS PRN Wonda Cerise, MD   150 mg at 12/11/11 2354  . tretinoin (RETIN-A) 0.025 % cream 1 application  1 application Topical QHS Verne Spurr, PA-C   1 application at 12/09/11 2200  . valACYclovir (VALTREX) tablet 1,000 mg  1,000 mg Oral Daily Verne Spurr, PA-C   1,000 mg at 12/16/11 0755  . DISCONTD: QUEtiapine (SEROQUEL XR) 24 hr tablet 1,000 mg  1,000 mg Oral QHS Curlene Labrum Readling, MD   1,000 mg at 12/14/11 2222   Lab Results:  No results found for this or any previous visit (from the past 48 hour(s)). Review of Systems:  Neurological: The patient denies any headaches today. She denies any seizures or dizziness.  G.I.: The patient reports ongoing constipation today and G.I. Upset today.  Musculoskeletal: The patient denies any muscle or skeletal difficulties.  Skin: Numerous insect bites on arms and legs - healing. Patient complains of itching.  Abdominal X-ray: December 11, 2011  Clinical Data: Abdominal pain and bloating.  ABDOMEN - 2 VIEW  Comparison: None.  Findings: There is no free intraperitoneal air. No evidence of  bowel obstruction. Large stool burden noted.   IMPRESSION:  No acute finding.  Large stool burden noted.  Original Report Authenticated By: Bernadene Bell. Maricela Curet, M.D.   Time was spent today discussing with the patient her current symptoms. Ms. Sulton reports she got "the best night of sleep I have in a long time" last night, though sleep hours are recorded as 3.5. The patient does seem less irritable this morning. Appetite is good. She denies side effects to medications. Her family visited last night and she reports this went very well. Remains very concerned about her constipation, stating she  is not getting any relief from her current medications.  Plan:  1. Reduce Seroquel XR again to 300 mg po qhs and increase Risperdal to 2 mg po qhs, given concerns over Seroquel's anticholinergic effects and patient's complaint of constipation and abdominal distension.  2. Consider GI consult for further assistance with constipation, per internal medicine recs. 3. Will continue the patient on her non-psychiatric medications.  4. Laboratory studies reviewed.  5. Will continue to monitor.  6. Case Manager is continuing to work on discharge planning for the patient.  Currently [facility] appears to be the best option.  Addendum: Discussed case with GI medicine. Recommendations included GoLytely to clean patient out; if this is ineffective may consider Linzess (chronic mgmt), soap suds enema, or sorbitol enema. Recommendation was made for the patient to follow up with a GI specialist after discharge for ongoing management of chronic constipation.  Eligah East 12/16/2011, 11:06 AM

## 2011-12-17 DIAGNOSIS — F1410 Cocaine abuse, uncomplicated: Secondary | ICD-10-CM

## 2011-12-17 DIAGNOSIS — F429 Obsessive-compulsive disorder, unspecified: Secondary | ICD-10-CM

## 2011-12-17 MED ORDER — RISPERIDONE 3 MG PO TABS
3.0000 mg | ORAL_TABLET | Freq: Every day | ORAL | Status: DC
  Administered 2011-12-17: 3 mg via ORAL
  Filled 2011-12-17 (×4): qty 1

## 2011-12-17 NOTE — Progress Notes (Signed)
12/17/2011         Time: 0930      Group Topic/Focus: The focus of this group is on enhancing patients' problem solving skills, which involves identifying the problem, brainstorming solutions and choosing and trying a solution.    Participation Level: Active  Participation Quality: Appropriate, Attentive and Supportive  Affect: Appropriate  Cognitive: Oriented   Additional Comments: Patient encouraging to another female peer who was having difficulty focusing during group.   Lacey Moses 12/17/2011 10:14 AM

## 2011-12-17 NOTE — Discharge Planning (Signed)
Case Manager contacted [facility] to ask about their receipt of patient's faxed application from Friday.  They had reviewed this and said the next step is to ensure that patient is compliant with her HIV meds, and stable on the meds.  After this is ascertained the next step will be a phone interview.  Met with patient in her room, as she did not stay in Aftercare Planning Group long enough to share this morning.  She did sign a Release of Information for [facility] to release records regarding her current stability on HIV meds, in order for those to be submitted to [facility] at their request.  Those were received and patient signed a  release for them to go on to the treatment program.  In the meantime, called [facility] to check on referral for patient to that program.  It is under review at this time.  Ambrose Mantle, LCSW 12/17/2011, 1:33 PM

## 2011-12-17 NOTE — Progress Notes (Signed)
Pt denies SI/HI/AVH. Pt guarded and isolative. States that she wants to be left alone. Pt rates her depression and hopelessness both as 0. Pt complained of constipation. Prune juice given. Support and encouragement offered. Pt receptive.

## 2011-12-17 NOTE — Progress Notes (Signed)
Patient ID: Lacey Moses, female   DOB: Jan 14, 1966, 46 y.o.   MRN: [id number] Pt. In bed, resting, eyes closed, resp. Even, unlabored. Staff will monitor q16min for safety.

## 2011-12-17 NOTE — Tx Team (Signed)
Interdisciplinary Treatment Plan Update (Adult)  Date:  12/17/2011  Time Reviewed:  10:15AM-11:15AM  Progress in Treatment: Attending groups:  Seldom, only recreation therapy Participating in groups:    Only the one group that she attends Taking medication as prescribed:    Yes Tolerating medication:   Yes Family/Significant other contact made:  Yes, with father Patient understands diagnosis:   Yes, poor insight, poor judgment Discussing patient identified problems/goals with staff:   Yes Medical problems stabilized or resolved:   Still complaining of constipation despite bowel movements Denies suicidal/homicidal ideation:  Yes Issues/concerns per patient self-inventory:   Constipation Other:    New problem(s) identified: No, Describe:    Reason for Continuation of Hospitalization: Anxiety Depression Medication stabilization Other; describe hopelessness, need placement for substance abuse  Interventions implemented related to continuation of hospitalization:  Medication monitoring and adjustment, safety checks Q15 min., suicide risk assessment, group therapy, psychoeducation, collateral contact, aftercare planning, ongoing physician assessments, medication education  Additional comments:  Patient will be put under IVC today.  Estimated length of stay:  2-3 days  Discharge Plan:  Go to rehab, either at [facility] or at [facility]  New goal(s):  Not applicable  Review of initial/current patient goals per problem list:   1.  Goal(s):  Deny SI for 48 hours prior to D/C.  Met:  Yes  Target date:  By Discharge   As evidenced by:  Denies  2.  Goal(s):  Decide if & how to address substance abuse issues at discharge.  Met:  No  Target date:  By Discharge   As evidenced by:  It is between [facility] and [facility], depending on which accepts her first  3.  Goal(s):  Reduce auditory and visual hallucinations to baseline per patient and collateral report.  Met:   No  Target date:  By Discharge   As evidenced by:  Denies  4.  Goal(s):  Reduce depression to no greater than 3 at discharge.  Met:  No  Target date:  By Discharge   As evidenced by:  "10" today, also for hopelessness  Attendees: Patient:  Lacey Moses  12/17/2011 10:15AM-11:15AM  Family:     Physician:  Dr. Harvie Heck Readling 12/17/2011 10:15AM-11:15AM  Nursing:   Izola Price, RN 12/17/2011 10:15AM -11:15AM   Case Manager:  Ambrose Mantle, LCSW 12/17/2011 10:15AM-11:15AM  Counselor:  Veto Kemps, MT-BC 12/17/2011 10:15AM-11:15AM  Other:   Omelia Blackwater, RN 12/17/2011 10:15AM-11:15AM  Other:      Other:      Other:       Scribe for Treatment Team:   Sarina Ser, 12/17/2011, 10:15AM-11:15AM

## 2011-12-17 NOTE — Progress Notes (Signed)
[facility] Group Notes:  (Counselor/Nursing/MHT/Case Management/Adjunct)  12/17/2011 4:31 PM  Type of Therapy:  Group Therapy at 11 and 1:15  Participation Level:  Did Not Attend  Clide Dales 12/17/2011, 4:31 PM

## 2011-12-17 NOTE — Progress Notes (Signed)
Pt this morning and throughout the day has been very agitated and has been isolating to her room. Pt rated depression and hopelessness at a 10. Pt attends groups. Pt still complains of constipation. Pt was offered support and encouragement. Pt receptive to treatment and safety maintained on the unit.

## 2011-12-17 NOTE — Progress Notes (Signed)
[facility] MD Progress Note  12/17/2011 4:08 PM  Diagnosis:  Axis I: Major Depressive Disorder - Recurrent - Severe.  Alcohol Dependence.  Cocaine Abuse.  Cannabis Abuse.   The patient was seen today and reports the following:   ADL's: Intact.  Sleep: The patient reports to having some difficulty initiating and maintaining sleep.  Appetite: The patient reports a good appetite today.   Mild>(1-10) >Severe  Hopelessness (1-10): 10  Depression (1-10): 10  Anxiety (1-10): 4-5   Suicidal Ideation: The patient denies any suicidal ideations today.  Plan: No  Intent: No  Means: No   Homicidal Ideation: The patient denies any homicidal ideations today.  Plan: No  Intent: No.  Means: No   General Appearance/Behavior: The patient remains cooperative today with this provider.  Eye Contact: Good.  Speech: Appropriate in rate and volume with no pressuring noted today.  Motor Behavior: wnl.  Level of Consciousness: Alert and Oriented x 3.  Mental Status: Alert and Oriented x 3.  Mood: Appears moderately depressed today.  Affect: Appears moderately constricted and angry.  Anxiety Level: Appears moderately anxious today.  Thought Process: wnl.  Thought Content: The patient denies any auditory or visual hallucinations today as well as any delusional thinking.  Perception:. wnl.  Judgment: Fair.  Insight: Poor.  Cognition: Oriented to person, place and time.  Sleep:  Number of Hours: 6.5    Vital Signs:Blood pressure 129/89, pulse 91, temperature 98.4 F (36.9 C), temperature source Oral, resp. rate 20, height 5\' 4"  (1.626 m), weight 78.019 kg (172 lb).  Current Medications: Current Facility-Administered Medications  Medication Dose Route Frequency Provider Last Rate Last Dose  . acetaminophen (TYLENOL) tablet 650 mg  650 mg Oral Q6H PRN Nehemiah Settle, MD   650 mg at 12/13/11 0815  . alum & mag hydroxide-simeth (MAALOX/MYLANTA) 200-200-20 MG/5ML suspension 30 mL  30 mL Oral  Q4H PRN Nehemiah Settle, MD      . bisacodyl (DULCOLAX) suppository 10 mg  10 mg Rectal QHS Laveda Norman, MD   10 mg at 12/16/11 2152  . camphor-menthol (SARNA) lotion   Topical PRN Wonda Cerise, MD      . diphenhydrAMINE (BENADRYL) capsule 50 mg  50 mg Oral Q6H PRN Verne Spurr, PA-C   50 mg at 12/15/11 1715  . efavirenz-emtrictabine-tenofovir (ATRIPLA) 600-200-300 MG per tablet 1 tablet  1 tablet Oral QHS Ronny Bacon, MD   1 tablet at 12/16/11 2151  . fluocinonide (LIDEX) 0.05 % external solution 1 application  1 application Topical BID Ronny Bacon, MD   1 application at 12/16/11 1729  . gabapentin (NEURONTIN) capsule 400 mg  400 mg Oral [id number] Curlene Labrum Cyan Clippinger, MD   400 mg at 12/17/11 1422  . haloperidol (HALDOL) tablet 5 mg  5 mg Oral Q6H PRN Ronny Bacon, MD   5 mg at 12/14/11 1458  . hydrochlorothiazide (HYDRODIURIL) tablet 25 mg  25 mg Oral Daily Curlene Labrum Zhana Jeangilles, MD   25 mg at 12/17/11 0833  . LORazepam (ATIVAN) tablet 1 mg  1 mg Oral Q6H PRN Ronny Bacon, MD   1 mg at 12/15/11 2134  . magnesium citrate solution 1 Bottle  1 Bottle Oral Once Curlene Labrum Rajon Bisig, MD      . magnesium hydroxide (MILK OF MAGNESIA) suspension 30 mL  30 mL Oral Daily PRN Nehemiah Settle, MD   30 mL at 12/16/11 0527  . mulitivitamin with minerals tablet 1 tablet  1 tablet  Oral Daily Ronny Bacon, MD   1 tablet at 12/17/11 0834  . ondansetron (ZOFRAN-ODT) disintegrating tablet 4 mg  4 mg Oral Q8H PRN Nehemiah Settle, MD   4 mg at 12/14/11 0410  . polyethylene glycol (MIRALAX / GLYCOLAX) packet 17 g  17 g Oral BID Laveda Norman, MD   17 g at 12/17/11 4098  . risperiDONE (RISPERDAL) tablet 3 mg  3 mg Oral QHS Curlene Labrum Idalis Hoelting, MD      . sertraline (ZOLOFT) tablet 150 mg  150 mg Oral Daily Curlene Labrum Maressa Apollo, MD   150 mg at 12/17/11 0834  . simethicone (MYLICON) chewable tablet 80 mg  80 mg Oral QID Laveda Norman, MD   80 mg at 12/17/11 1140  . simvastatin (ZOCOR) tablet 10  mg  10 mg Oral q1800 Verne Spurr, PA-C   10 mg at 12/16/11 1730  . sodium chloride (OCEAN) 0.65 % nasal spray 1 spray  1 spray Each Nare PRN Ronny Bacon, MD   1 spray at 12/16/11 0754  . sodium phosphate (FLEET) 7-19 GM/118ML enema 1 enema  1 enema Rectal BID PRN Viviann Spare, FNP   1 enema at 12/10/11 1352  . thiamine (VITAMIN B-1) tablet 100 mg  100 mg Oral Daily Alyson Kuroski-Mazzei, DO   100 mg at 12/17/11 0834  . traZODone (DESYREL) tablet 150 mg  150 mg Oral QHS PRN Wonda Cerise, MD   150 mg at 12/11/11 2354  . tretinoin (RETIN-A) 0.025 % cream 1 application  1 application Topical QHS Verne Spurr, PA-C   1 application at 12/09/11 2200  . valACYclovir (VALTREX) tablet 1,000 mg  1,000 mg Oral Daily Verne Spurr, PA-C   1,000 mg at 12/17/11 1191  . DISCONTD: QUEtiapine (SEROQUEL XR) 24 hr tablet 300 mg  300 mg Oral QHS Curlene Labrum Carlosdaniel Grob, MD   300 mg at 12/16/11 2152  . DISCONTD: risperiDONE (RISPERDAL) tablet 2 mg  2 mg Oral QHS Curlene Labrum Bowdy Bair, MD   2 mg at 12/16/11 2152   Lab Results: No results found for this or any previous visit (from the past 48 hour(s)).  Review of Systems:  Neurological: The patient denies any headaches today. She denies any seizures or dizziness.  G.I.: The patient reports ongoing constipation today which is improving and denies any G.I. Upset today.  Musculoskeletal: The patient denies any muscle or skeletal difficulties.   Abdominal X-ray: December 11, 2011  Clinical Data: Abdominal pain and bloating.  ABDOMEN - 2 VIEW  Comparison: None.  Findings: There is no free intraperitoneal air. No evidence of  bowel obstruction. Large stool burden noted.  IMPRESSION:  No acute finding. Large stool burden noted.  Original Report Authenticated By: Bernadene Bell. Maricela Curet, M.D.   Time was spent today discussing with the patient her current symptoms. The patient reports to having some difficulty sleeping last night. She reports a good appetite and is exhibiting  moderate feelings of sadness, anhedonia and depressed mood. She also is exhibiting moderate anxiety. She denies any suicidal or homicidal ideations today and denies any auditory or visual hallucinations or delusional thinking today.   The patient continues to express a desire for placement at a long term treatment facility for alcohol and illicit drugs.  Treatment Plan Summary:  1. Daily contact with patient to assess and evaluate symptoms and progress in treatment.  2. Medication management  3. The patient will deny suicidal ideations or homicidal ideations for 48 hours prior to  discharge and have a depression and anxiety rating of 3 or less. The patient will also deny any auditory or visual hallucinations or delusional thinking.  4. The patient will deny any symptoms of substance withdrawal at time of discharge.   Plan:  1. Will continue the medication Zoloft at 150 mgs po q am for depression and anxiety.  2. Will continue the medication Neurontin to 400 mgs po q am, 2 pm and hs for anxiety and mood stabilization.  3. Will discontinue the medication Seroquel XR 1000 mgs po qhs for sleep and mood stabilization.  4. Will increase the medication Risperdal to 3 mgs po qhs which was started over the weekend in place of the Seroquel for mood stabilization. 5. Will continue the medication Trazodone 100 mgs po qhs - prn for sleep.  6. Internal Medicine will continue to manage the patient's multiple medical issues.  7. Will continue the patient on her non-psychiatric medications.  8. Laboratory studies reviewed.  9. Will continue to monitor.  10. Case Manager is continuing to work on discharge planning for the patient. Current [facility] appears to be the best option.  Louanna Vanliew 12/17/2011, 4:08 PM

## 2011-12-18 MED ORDER — OLANZAPINE 5 MG PO TABS
5.0000 mg | ORAL_TABLET | Freq: Every day | ORAL | Status: DC
  Administered 2011-12-18 – 2011-12-19 (×2): 5 mg via ORAL
  Filled 2011-12-18 (×4): qty 1

## 2011-12-18 NOTE — Progress Notes (Signed)
Patient has been up and visible in the milieu today.  Is interacting well with staff and peers.  Tolerating medications well. Patient had a phone interview today with [facility] and was provided with a list of medications prior to the interview.  She has been pleasant and cooperative today.

## 2011-12-18 NOTE — Progress Notes (Signed)
Lying quietly in bed with eyes closed.  Q 15 minute safety checks in progress. 

## 2011-12-18 NOTE — Progress Notes (Signed)
[facility] MD Progress Note  12/18/2011 1:44 PM  Diagnosis:  Axis I: Major Depressive Disorder - Recurrent - Severe.  Alcohol Dependence.  Cocaine Abuse.  Cannabis Abuse.   The patient was seen today and reports the following:   ADL's: Intact.  Sleep: The patient reports to having some difficulty initiating and maintaining sleep.  Appetite: The patient reports a good appetite today.   Mild>(1-10) >Severe  Hopelessness (1-10): 7  Depression (1-10): 7  Anxiety (1-10): 4-5   Suicidal Ideation: The patient denies any suicidal ideations today.  Plan: No  Intent: No  Means: No   Homicidal Ideation: The patient denies any homicidal ideations today.  Plan: No  Intent: No.  Means: No   General Appearance/Behavior: The patient remains cooperative today with this provider.  Eye Contact: Good.  Speech: Appropriate in rate and volume with no pressuring noted today.  Motor Behavior: wnl.  Level of Consciousness: Alert and Oriented x 3.  Mental Status: Alert and Oriented x 3.  Mood: Appears mildly depressed today.  Affect: Appears mildly constricted.  Anxiety Level: Appears mildly anxious today.  Thought Process: wnl.  Thought Content: The patient denies any auditory or visual hallucinations today as well as any delusional thinking.  Perception:. wnl.  Judgment: Fair.  Insight: Fair.  Cognition: Oriented to person, place and time.  Sleep:  Number of Hours: 5.5    Vital Signs:Blood pressure 131/89, pulse 97, temperature 97.3 F (36.3 C), temperature source Oral, resp. rate 18, height 5\' 4"  (1.626 m), weight 78.019 kg (172 lb).  Current Medications: Current Facility-Administered Medications  Medication Dose Route Frequency Provider Last Rate Last Dose  . acetaminophen (TYLENOL) tablet 650 mg  650 mg Oral Q6H PRN Nehemiah Settle, MD   650 mg at 12/13/11 0815  . alum & mag hydroxide-simeth (MAALOX/MYLANTA) 200-200-20 MG/5ML suspension 30 mL  30 mL Oral Q4H PRN Nehemiah Settle, MD      . bisacodyl (DULCOLAX) suppository 10 mg  10 mg Rectal QHS Laveda Norman, MD   10 mg at 12/16/11 2152  . camphor-menthol (SARNA) lotion   Topical PRN Wonda Cerise, MD      . diphenhydrAMINE (BENADRYL) capsule 50 mg  50 mg Oral Q6H PRN Verne Spurr, PA-C   50 mg at 12/15/11 1715  . efavirenz-emtrictabine-tenofovir (ATRIPLA) 600-200-300 MG per tablet 1 tablet  1 tablet Oral QHS Ronny Bacon, MD   1 tablet at 12/17/11 2206  . fluocinonide (LIDEX) 0.05 % external solution 1 application  1 application Topical BID Ronny Bacon, MD   1 application at 12/18/11 0814  . gabapentin (NEURONTIN) capsule 400 mg  400 mg Oral [id number] Curlene Labrum Timmi Devora, MD   400 mg at 12/18/11 0814  . haloperidol (HALDOL) tablet 5 mg  5 mg Oral Q6H PRN Ronny Bacon, MD   5 mg at 12/14/11 1458  . hydrochlorothiazide (HYDRODIURIL) tablet 25 mg  25 mg Oral Daily Curlene Labrum Kaisley Stiverson, MD   25 mg at 12/18/11 0814  . LORazepam (ATIVAN) tablet 1 mg  1 mg Oral Q6H PRN Ronny Bacon, MD   1 mg at 12/15/11 2134  . magnesium citrate solution 1 Bottle  1 Bottle Oral Once Curlene Labrum Karion Cudd, MD      . magnesium hydroxide (MILK OF MAGNESIA) suspension 30 mL  30 mL Oral Daily PRN Nehemiah Settle, MD   30 mL at 12/16/11 0527  . mulitivitamin with minerals tablet 1 tablet  1 tablet Oral Daily  Ronny Bacon, MD   1 tablet at 12/18/11 [phone]  . OLANZapine (ZYPREXA) tablet 5 mg  5 mg Oral QHS Kaiyon Hynes D Alesa Echevarria, MD      . ondansetron (ZOFRAN-ODT) disintegrating tablet 4 mg  4 mg Oral Q8H PRN Nehemiah Settle, MD   4 mg at 12/14/11 0410  . polyethylene glycol (MIRALAX / GLYCOLAX) packet 17 g  17 g Oral BID Laveda Norman, MD   17 g at 12/18/11 0814  . sertraline (ZOLOFT) tablet 150 mg  150 mg Oral Daily Curlene Labrum Elizabeth Haff, MD   150 mg at 12/18/11 0814  . simethicone (MYLICON) chewable tablet 80 mg  80 mg Oral QID Laveda Norman, MD   80 mg at 12/18/11 1324  . simvastatin (ZOCOR) tablet 10 mg  10 mg Oral q1800  Verne Spurr, PA-C   10 mg at 12/17/11 1709  . sodium chloride (OCEAN) 0.65 % nasal spray 1 spray  1 spray Each Nare PRN Ronny Bacon, MD   1 spray at 12/16/11 0754  . sodium phosphate (FLEET) 7-19 GM/118ML enema 1 enema  1 enema Rectal BID PRN Viviann Spare, FNP   1 enema at 12/10/11 1352  . thiamine (VITAMIN B-1) tablet 100 mg  100 mg Oral Daily Alyson Kuroski-Mazzei, DO   100 mg at 12/18/11 0814  . traZODone (DESYREL) tablet 150 mg  150 mg Oral QHS PRN Wonda Cerise, MD   150 mg at 12/11/11 2354  . tretinoin (RETIN-A) 0.025 % cream 1 application  1 application Topical QHS Verne Spurr, PA-C   1 application at 12/09/11 2200  . valACYclovir (VALTREX) tablet 1,000 mg  1,000 mg Oral Daily Verne Spurr, PA-C   1,000 mg at 12/18/11 0814  . DISCONTD: QUEtiapine (SEROQUEL XR) 24 hr tablet 300 mg  300 mg Oral QHS Curlene Labrum Eureka Valdes, MD   300 mg at 12/16/11 2152  . DISCONTD: risperiDONE (RISPERDAL) tablet 2 mg  2 mg Oral QHS Curlene Labrum Katherleen Folkes, MD   2 mg at 12/16/11 2152  . DISCONTD: risperiDONE (RISPERDAL) tablet 3 mg  3 mg Oral QHS Curlene Labrum Kaysen Sefcik, MD   3 mg at 12/17/11 2206   Lab Results: No results found for this or any previous visit (from the past 48 hour(s)).  Review of Systems:  Neurological: The patient denies any headaches today. She denies any seizures or dizziness.  G.I.: The patient denies any constipation today and denies any G.I. Upset today.  Musculoskeletal: The patient denies any muscle or skeletal difficulties.   Time was spent today discussing with the patient her current symptoms. The patient reports to having some difficulty sleeping last night. She reports a good appetite and is exhibiting mild feelings of sadness, anhedonia and depressed mood. She also is exhibiting mild anxiety. She denies any suicidal or homicidal ideations today and denies any auditory or visual hallucinations or delusional thinking today.   The patient continues to express a desire for placement at a long  term treatment facility for alcohol and illicit drugs. She states that she has a telephone interview at 10 am today with "[facility]" which is a 2 year program for drug and alcohol issues.  The patient also states today that she cannot take the medication Risperdal and be accepted into the program.  However according to the patient's Case Manager, Zyprexa is allowed.  Treatment Plan Summary:  1. Daily contact with patient to assess and evaluate symptoms and progress in treatment.  2. Medication management  3. The patient will deny suicidal ideations or homicidal ideations for 48 hours prior to discharge and have a depression and anxiety rating of 3 or less. The patient will also deny any auditory or visual hallucinations or delusional thinking.  4. The patient will deny any symptoms of substance withdrawal at time of discharge.   Plan:  1. Will continue the medication Zoloft at 150 mgs po q am for depression and anxiety.  2. Will continue the medication Neurontin to 400 mgs po q am, 2 pm and hs for anxiety and mood stabilization.  3. Will discontinue the medication Risperdal due to this not being allowed at the treatment center where the patient is applying for admission. 4. Will start Zyprexa 5 mgs po qhs for sleep and mood stabilization.  This medication was started in place of the medication Risperdal due to it being allowed at the above mentioned treatment center.  5. Will continue the medication Trazodone 100 mgs po qhs - prn for sleep.  6. Will continue the patient on her non-psychiatric medications.  7. Laboratory studies reviewed.  8. Will continue to monitor.   Bradey Luzier 12/18/2011, 1:44 PM

## 2011-12-18 NOTE — Discharge Planning (Signed)
Met with patient in Aftercare Planning Group.   This is the first time that she stayed for almost the whole group.  Patient was able to contain her frustration when another patient expressed some delusional thoughts about her in an aggressive manner.  Case Manager told patient of today's scheduled telephone interview with Rogelia Rohrer at [facility] at 10:30AM.  Explained to her that she may be asked about her medication, and that she is currently on meds that will not be accepted by the facility, so she would need to explain to them that these will be switched when/if she is accepted to the program.  Case Manager also called [facility] and was told that patient has been cleared to go there; however, there is a 5-6 week wait list for female beds.  Ambrose Mantle, LCSW 12/18/2011, 10:05 AM

## 2011-12-18 NOTE — Progress Notes (Signed)
Pt reports her day has gotten better as it progressed.  Pt had an interview with [facility] in [facility] and is hoping to get accepted there.  Gave support and encouragement, and discussed how nice that area of [place] is.  Her other option will probably be to go to [facility].  She reports her bowel movements are getting better.  She denies SI/HI with this Clinical research associate.  She voices no needs/concerns at this time.  She is appropriate with staff and peers.  Safety maintained with q15 minute checks.

## 2011-12-18 NOTE — Progress Notes (Signed)
[facility] Group Notes:  (Counselor/Nursing/MHT/Case Management/Adjunct)  12/18/2011 12:44 PM  Type of Therapy:  Group Therapy at 11 AM  Participation Level:  Did Not Attend   Lacey Moses 12/18/2011, 12:44 PM

## 2011-12-19 MED ORDER — IBUPROFEN 600 MG PO TABS
600.0000 mg | ORAL_TABLET | Freq: Three times a day (TID) | ORAL | Status: DC | PRN
  Administered 2011-12-19 – 2011-12-26 (×9): 600 mg via ORAL
  Filled 2011-12-19 (×9): qty 1

## 2011-12-19 MED ORDER — IBUPROFEN 100 MG/5ML PO SUSP: 600.0000 mg | Freq: Three times a day (TID) | ORAL | Status: DC | PRN

## 2011-12-19 MED ORDER — LOPERAMIDE HCL 2 MG PO CAPS
2.0000 mg | ORAL_CAPSULE | ORAL | Status: DC | PRN
  Administered 2011-12-19 – 2011-12-21 (×6): 2 mg via ORAL

## 2011-12-19 NOTE — Progress Notes (Signed)
[facility] MD Progress Note  12/19/2011 2:13 PM  Diagnosis:  Axis I: Major Depressive Disorder - Recurrent - Severe.  Alcohol Dependence.  Cocaine Abuse.  Cannabis Abuse.   The patient was seen today and reports the following:   ADL's: Intact.  Sleep: The patient reports to having some ongoing difficulty initiating and maintaining sleep.  Appetite: The patient reports a good appetite today.   Mild>(1-10) >Severe  Hopelessness (1-10): 9  Depression (1-10): 9  Anxiety (1-10): 9   Suicidal Ideation: The patient denies any suicidal ideations today.  Plan: No  Intent: No  Means: No   Homicidal Ideation: The patient denies any homicidal ideations today.  Plan: No  Intent: No.  Means: No   General Appearance/Behavior: The patient remains cooperative today with this provider.  Eye Contact: Good.  Speech: Appropriate in rate and volume with no pressuring noted today.  Motor Behavior: wnl.  Level of Consciousness: Alert and Oriented x 3.  Mental Status: Alert and Oriented x 3.  Mood: Appears moderately depressed today.  Affect: Appears moderately constricted.  Anxiety Level: Appears mildly anxious today.  Thought Process: wnl.  Thought Content: The patient denies any auditory or visual hallucinations today as well as any delusional thinking.  Perception:. wnl.  Judgment: Fair.  Insight: Fair.  Cognition: Oriented to person, place and time.  Sleep:  Number of Hours: 4.25    Vital Signs:Blood pressure 116/78, pulse 102, temperature 98.2 F (36.8 C), temperature source Oral, resp. rate 20, height 5\' 4"  (1.626 m), weight 78.019 kg (172 lb).  Current Medications: Current Facility-Administered Medications  Medication Dose Route Frequency Provider Last Rate Last Dose  . acetaminophen (TYLENOL) tablet 650 mg  650 mg Oral Q6H PRN Nehemiah Settle, MD   650 mg at 12/19/11 0852  . alum & mag hydroxide-simeth (MAALOX/MYLANTA) 200-200-20 MG/5ML suspension 30 mL  30 mL Oral Q4H PRN  Nehemiah Settle, MD      . bisacodyl (DULCOLAX) suppository 10 mg  10 mg Rectal QHS Laveda Norman, MD   10 mg at 12/16/11 2152  . camphor-menthol (SARNA) lotion   Topical PRN Wonda Cerise, MD      . diphenhydrAMINE (BENADRYL) capsule 50 mg  50 mg Oral Q6H PRN Verne Spurr, PA-C   50 mg at 12/15/11 1715  . efavirenz-emtrictabine-tenofovir (ATRIPLA) 600-200-300 MG per tablet 1 tablet  1 tablet Oral QHS Ronny Bacon, MD   1 tablet at 12/18/11 2129  . fluocinonide (LIDEX) 0.05 % external solution 1 application  1 application Topical BID Ronny Bacon, MD   1 application at 12/18/11 0814  . gabapentin (NEURONTIN) capsule 400 mg  400 mg Oral BH-[id number] Curlene Labrum Othello Dickenson, MD   400 mg at 12/19/11 1400  . haloperidol (HALDOL) tablet 5 mg  5 mg Oral Q6H PRN Ronny Bacon, MD   5 mg at 12/14/11 1458  . hydrochlorothiazide (HYDRODIURIL) tablet 25 mg  25 mg Oral Daily Curlene Labrum Fenton Candee, MD   25 mg at 12/19/11 0851  . ibuprofen (ADVIL,MOTRIN) tablet 600 mg  600 mg Oral Q8H PRN Curlene Labrum Aidin Doane, MD   600 mg at 12/19/11 1321  . loperamide (IMODIUM) capsule 2 mg  2 mg Oral PRN Curlene Labrum Vickie Melnik, MD   2 mg at 12/19/11 1321  . LORazepam (ATIVAN) tablet 1 mg  1 mg Oral Q6H PRN Ronny Bacon, MD   1 mg at 12/15/11 2134  . magnesium citrate solution 1 Bottle  1 Bottle Oral Once  Curlene Labrum Hayat Warbington, MD      . magnesium hydroxide (MILK OF MAGNESIA) suspension 30 mL  30 mL Oral Daily PRN Nehemiah Settle, MD   30 mL at 12/16/11 0527  . mulitivitamin with minerals tablet 1 tablet  1 tablet Oral Daily Ronny Bacon, MD   1 tablet at 12/19/11 0851  . OLANZapine (ZYPREXA) tablet 5 mg  5 mg Oral QHS Curlene Labrum Conrad Zajkowski, MD   5 mg at 12/18/11 2130  . ondansetron (ZOFRAN-ODT) disintegrating tablet 4 mg  4 mg Oral Q8H PRN Nehemiah Settle, MD   4 mg at 12/19/11 0854  . polyethylene glycol (MIRALAX / GLYCOLAX) packet 17 g  17 g Oral BID Laveda Norman, MD   17 g at 12/18/11 1722  . sertraline (ZOLOFT)  tablet 150 mg  150 mg Oral Daily Curlene Labrum Farron Lafond, MD   150 mg at 12/19/11 0850  . simethicone (MYLICON) chewable tablet 80 mg  80 mg Oral QID Laveda Norman, MD   80 mg at 12/19/11 1150  . simvastatin (ZOCOR) tablet 10 mg  10 mg Oral q1800 Verne Spurr, PA-C   10 mg at 12/18/11 1858  . sodium chloride (OCEAN) 0.65 % nasal spray 1 spray  1 spray Each Nare PRN Ronny Bacon, MD   1 spray at 12/16/11 0754  . sodium phosphate (FLEET) 7-19 GM/118ML enema 1 enema  1 enema Rectal BID PRN Viviann Spare, FNP   1 enema at 12/10/11 1352  . thiamine (VITAMIN B-1) tablet 100 mg  100 mg Oral Daily Alyson Kuroski-Mazzei, DO   100 mg at 12/19/11 0851  . traZODone (DESYREL) tablet 150 mg  150 mg Oral QHS PRN Wonda Cerise, MD   150 mg at 12/18/11 2226  . tretinoin (RETIN-A) 0.025 % cream 1 application  1 application Topical QHS Verne Spurr, PA-C   1 application at 12/18/11 2133  . valACYclovir (VALTREX) tablet 1,000 mg  1,000 mg Oral Daily Verne Spurr, PA-C   1,000 mg at 12/19/11 0850  . DISCONTD: ibuprofen (ADVIL,MOTRIN) 100 MG/5ML suspension 600 mg  600 mg Oral Q8H PRN Ronny Bacon, MD       Lab Results: No results found for this or any previous visit (from the past 48 hour(s)).  Review of Systems:  Neurological: The patient denies any headaches today. She denies any seizures or dizziness.  G.I.: The patient denies any constipation today but reports diarrhea but denies any G.I. Upset today.  Musculoskeletal: The patient denies any muscle or skeletal difficulties.   Time was spent today discussing with the patient her current symptoms. The patient reports to having ongoing difficulty sleeping last night. She reports a good appetite and is exhibiting moderate feelings of sadness, anhedonia and depressed mood. She also is exhibiting moderate anxiety. She denies any suicidal or homicidal ideations today and denies any auditory or visual hallucinations or delusional thinking today.   Time was spent  discussing with the patient her application to a 2 year treatment program for substance abuse issues.  The patient was informed that a warrant for her arrest is pending for a BE.  The patient states that she will contact her father to help address this issue.  Treatment Plan Summary:  1. Daily contact with patient to assess and evaluate symptoms and progress in treatment.  2. Medication management  3. The patient will deny suicidal ideations or homicidal ideations for 48 hours prior to discharge and have a depression and anxiety  rating of 3 or less. The patient will also deny any auditory or visual hallucinations or delusional thinking.  4. The patient will deny any symptoms of substance withdrawal at time of discharge.   Plan:  1. Will continue the medication Zoloft at 150 mgs po q am for depression and anxiety.  2. Will continue the medication Neurontin to 400 mgs po q am, 2 pm and hs for anxiety and mood stabilization.  3. Will continue Zyprexa 5 mgs po qhs for sleep and mood stabilization. This medication was started in place of the medication Risperdal due to it being allowed at the above mentioned treatment center.  4. Will continue the medication Trazodone 100 mgs po qhs - prn for sleep.  5. Will continue the patient on her non-psychiatric medications.  6. Laboratory studies reviewed.  7. Will continue to monitor.  8. Will start Imodium 2 mgs po q 2 hours - prn for diarrhea.  Morrissa Shein 12/19/2011, 2:13 PM

## 2011-12-19 NOTE — Discharge Planning (Signed)
Met with patient, her mentor Rev. Riley Lam, and her father via speaker phone in order to assist with creating a discharge plan that will enable the patient to go to [facility] as soon as possible if she is accepted there.  Earlier called the [employer] of the [facility] Department, and was told that patient will have to be arrested at the time the warrant is served because it is actually an arrest warrant.  Gave patient the telephone number to call to get details such as how much the assigned bond is.  She will then speak to her father about posting that bond.  We discussed the possibility of then working with the [facility] to see if there is a way to get the charge deferred in order for her to attend the 2-year rehabilitation program.  Case Manager asked if there is a possible place for patient to stay after she is discharged before going to rehab.  Her mentor asked that she go door to door, and Case Manager reminded them that at some point she will have to leave to get served/arrested, and make arrangements re the charges.  They will continue discussing this and working out the details.  Patient was very engaged in this process and is motivated for rehab.  Ambrose Mantle, LCSW 12/19/2011, 1:56 PM

## 2011-12-19 NOTE — Progress Notes (Signed)
Patient ID: Lacey Moses, female   DOB: March 07, 1966, 46 y.o.   MRN: [id number] Pt is awake and active on the unit this AM. Pt denies SI/HI but endorses auditory hallucinations. Pt is participating in the milieu and is cooperative with staff. Pt is somewhat lethargic today but is attending groups and meals. Pt c/o knee pain and loose stools. Discharge plan is still pending. Writer will continue to monitor.

## 2011-12-20 MED ORDER — OLANZAPINE 10 MG PO TABS
10.0000 mg | ORAL_TABLET | Freq: Every day | ORAL | Status: DC
  Administered 2011-12-20: 10 mg via ORAL
  Filled 2011-12-20 (×3): qty 1

## 2011-12-20 NOTE — Progress Notes (Signed)
[facility] Group Notes:  (Counselor/Nursing/MHT/Case Management/Adjunct)  12/20/2011 2:07 PM  Type of Therapy:  Group Therapy at 11:00 and 1:15  Participation Level:  Did Not Attend  Lacey Moses 12/20/2011, 2:07 PM

## 2011-12-20 NOTE — Progress Notes (Signed)
[facility] Group Notes:  (Counselor/Nursing/MHT/Case Management/Adjunct)  12/20/2011 12:36 PM  Type of Therapy:  Group Therapy at 11 and 1:15 on 09/18/11  Participation Level:  Did Not Attend  Clide Dales 12/20/2011, 12:36 PM

## 2011-12-20 NOTE — Progress Notes (Signed)
[facility] MD Progress Note  12/20/2011 2:35 PM  Diagnosis:  Axis I: Major Depressive Disorder - Recurrent - Severe.  Alcohol Dependence.  Cocaine Abuse.  Cannabis Abuse.   The patient was seen today and reports the following:   ADL's: Intact.  Sleep: The patient reports to having some ongoing difficulty initiating and maintaining sleep.  Appetite: The patient reports a good appetite today.   Mild>(1-10) >Severe  Hopelessness (1-10): 7-8  Depression (1-10): 7-8  Anxiety (1-10): 7-8   Suicidal Ideation: The patient adamantly denies any suicidal ideations today.  Plan: No  Intent: No  Means: No   Homicidal Ideation: The patient adamantly denies any homicidal ideations today.  Plan: No  Intent: No.  Means: No   General Appearance/Behavior:  The patient remains cooperative today with this provider.  Eye Contact: Good.  Speech: Appropriate in rate and volume with no pressuring noted today.  Motor Behavior: wnl.  Level of Consciousness: Alert and Oriented x 3.  Mental Status: Alert and Oriented x 3.  Mood: Appears moderately depressed today.  Affect: Appears moderately constricted.  Anxiety Level: Appears mildly anxious today.  Thought Process: wnl.  Thought Content: The patient denies any auditory or visual hallucinations today as well as any delusional thinking.  Perception:. wnl.  Judgment: Fair.  Insight: Fair.  Cognition: Oriented to person, place and time.  Sleep:  Number of Hours: 5.75    Vital Signs:Blood pressure 114/76, pulse 78, temperature 98.1 F (36.7 C), temperature source Oral, resp. rate 16, height 5\' 4"  (1.626 m), weight 78.019 kg (172 lb).  Current Medications: Current Facility-Administered Medications  Medication Dose Route Frequency Provider Last Rate Last Dose  . acetaminophen (TYLENOL) tablet 650 mg  650 mg Oral Q6H PRN Nehemiah Settle, MD   650 mg at 12/19/11 2145  . alum & mag hydroxide-simeth (MAALOX/MYLANTA) 200-200-20 MG/5ML suspension 30  mL  30 mL Oral Q4H PRN Nehemiah Settle, MD      . bisacodyl (DULCOLAX) suppository 10 mg  10 mg Rectal QHS Laveda Norman, MD   10 mg at 12/16/11 2152  . camphor-menthol (SARNA) lotion   Topical PRN Wonda Cerise, MD      . diphenhydrAMINE (BENADRYL) capsule 50 mg  50 mg Oral Q6H PRN Verne Spurr, PA-C   50 mg at 12/15/11 1715  . efavirenz-emtrictabine-tenofovir (ATRIPLA) 600-200-300 MG per tablet 1 tablet  1 tablet Oral QHS Ronny Bacon, MD   1 tablet at 12/19/11 2144  . fluocinonide (LIDEX) 0.05 % external solution 1 application  1 application Topical BID Ronny Bacon, MD   1 application at 12/18/11 0814  . gabapentin (NEURONTIN) capsule 400 mg  400 mg Oral BH-[id number] Curlene Labrum Eleena Grater, MD   400 mg at 12/20/11 1255  . haloperidol (HALDOL) tablet 5 mg  5 mg Oral Q6H PRN Ronny Bacon, MD   5 mg at 12/14/11 1458  . hydrochlorothiazide (HYDRODIURIL) tablet 25 mg  25 mg Oral Daily Curlene Labrum Delfina Schreurs, MD   25 mg at 12/20/11 1610  . ibuprofen (ADVIL,MOTRIN) tablet 600 mg  600 mg Oral Q8H PRN Curlene Labrum Nataliee Shurtz, MD   600 mg at 12/20/11 1255  . loperamide (IMODIUM) capsule 2 mg  2 mg Oral PRN Curlene Labrum Linken Mcglothen, MD   2 mg at 12/19/11 1321  . LORazepam (ATIVAN) tablet 1 mg  1 mg Oral Q6H PRN Ronny Bacon, MD   1 mg at 12/15/11 2134  . magnesium citrate solution 1 Bottle  1  Bottle Oral Once Curlene Labrum Amandine Covino, MD      . magnesium hydroxide (MILK OF MAGNESIA) suspension 30 mL  30 mL Oral Daily PRN Nehemiah Settle, MD   30 mL at 12/16/11 0527  . mulitivitamin with minerals tablet 1 tablet  1 tablet Oral Daily Ronny Bacon, MD   1 tablet at 12/20/11 1914  . OLANZapine (ZYPREXA) tablet 10 mg  10 mg Oral QHS Curlene Labrum Earvin Blazier, MD      . ondansetron (ZOFRAN-ODT) disintegrating tablet 4 mg  4 mg Oral Q8H PRN Nehemiah Settle, MD   4 mg at 12/19/11 0854  . polyethylene glycol (MIRALAX / GLYCOLAX) packet 17 g  17 g Oral BID Laveda Norman, MD   17 g at 12/18/11 1722  . sertraline  (ZOLOFT) tablet 150 mg  150 mg Oral Daily Curlene Labrum Kamiah Fite, MD   150 mg at 12/20/11 7829  . simethicone (MYLICON) chewable tablet 80 mg  80 mg Oral QID Laveda Norman, MD   80 mg at 12/20/11 1255  . simvastatin (ZOCOR) tablet 10 mg  10 mg Oral q1800 Verne Spurr, PA-C   10 mg at 12/19/11 1834  . sodium chloride (OCEAN) 0.65 % nasal spray 1 spray  1 spray Each Nare PRN Ronny Bacon, MD   1 spray at 12/16/11 0754  . sodium phosphate (FLEET) 7-19 GM/118ML enema 1 enema  1 enema Rectal BID PRN Viviann Spare, FNP   1 enema at 12/10/11 1352  . thiamine (VITAMIN B-1) tablet 100 mg  100 mg Oral Daily Alyson Kuroski-Mazzei, DO   100 mg at 12/20/11 5621  . traZODone (DESYREL) tablet 150 mg  150 mg Oral QHS PRN Wonda Cerise, MD   150 mg at 12/18/11 2226  . tretinoin (RETIN-A) 0.025 % cream 1 application  1 application Topical QHS Verne Spurr, PA-C   1 application at 12/19/11 2213  . valACYclovir (VALTREX) tablet 1,000 mg  1,000 mg Oral Daily Verne Spurr, PA-C   1,000 mg at 12/20/11 3086  . DISCONTD: OLANZapine (ZYPREXA) tablet 5 mg  5 mg Oral QHS Curlene Labrum Deah Ottaway, MD   5 mg at 12/19/11 2159   Lab Results: No results found for this or any previous visit (from the past 48 hour(s)).  Review of Systems:  Neurological: The patient denies any headaches today. She denies any seizures or dizziness.  G.I.: The patient denies any constipation today but reports ongoing diarrhea but denies any G.I. Upset today.  Musculoskeletal: The patient denies any muscle or skeletal difficulties.   Time was spent today discussing with the patient her current symptoms. The patient reports to having ongoing difficulty initiating and maintaining. She reports a good appetite and continues to exhibit moderate feelings of sadness, anhedonia and depressed mood. She also is exhibiting ongoing mild to moderate anxiety. She adamantly denies any suicidal or homicidal ideations today and denies any auditory or visual hallucinations or  delusional thinking today.   The patient states that she and her Father are working on a plan to have her legal charges addressed in order for her to enter the 2 year treatment program she has applied.  The patient states that she should hear either today or tomorrow if she is accepted into the program.  Treatment Plan Summary:  1. Daily contact with patient to assess and evaluate symptoms and progress in treatment.  2. Medication management  3. The patient will deny suicidal ideations or homicidal ideations for 48 hours prior  to discharge and have a depression and anxiety rating of 3 or less. The patient will also deny any auditory or visual hallucinations or delusional thinking.  4. The patient will deny any symptoms of substance withdrawal at time of discharge.   Plan:  1. Will continue the medication Zoloft at 150 mgs po q am for depression and anxiety.  2. Will continue the medication Neurontin to 400 mgs po q am, 2 pm and hs for anxiety and mood stabilization.  3. Will continue the medication Zyprexa at the increased dosage of 10 mgs po qhs for sleep and mood stabilization.  4. Will continue the medication Trazodone 100 mgs po qhs - prn for sleep.  5. Will continue the patient on her non-psychiatric medications.  6. Laboratory studies reviewed.  7. Will continue to monitor.  8. Will continue the medication Imodium 2 mgs po q 2 hours - prn for diarrhea.  Vashaun Osmon 12/20/2011, 2:35 PM

## 2011-12-20 NOTE — Progress Notes (Signed)
Patient up and in the milieu, but did not attend groups today.  Very tired this morning.  Appetite good.  Tolerating medications well.  Denies suicidal ideation.  Continues to wait on a solid discharge plan, but plans to go into long term rehab if they will accept her plan.  Has been pleasant and cooperative with staff and peers.

## 2011-12-20 NOTE — Discharge Planning (Signed)
Met with patient individually briefly.  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  Her plan now is to wait for word from [facility] at [facility] to see if she is definitely going to be accepted there pending the resolution of her charge.  If so, she plans to leave the hospital, turn herself in to receive the warrant, bond out, go to live with either mother or father until court date.  She will need letters from [facility] and [facility] stating that she needs to go to this treatment, and is accepted there.  Case Manager had a request from the residential treatment program for a recent doctor evaluation of patient's psychiatric status, a letter stating she has no work restrictions, and TB test results.  These were all faxed to them today.  Utilization review due today to see additional day(s).  Ambrose Mantle, LCSW 12/20/2011, 12:27 PM

## 2011-12-20 NOTE — Progress Notes (Signed)
Patient ID: Lacey Moses, female   DOB: 10/01/1965, 46 y.o.   MRN: [id number] Pt. Reports pain from recent day of volleyball. Writer noted pt. Had been given pain med at 1835, Pt. Encouraged to use heat and stay of feet for a while to minimize pain. Pt. Writer also noted no pain or swelling at cite.  Pt. Reports doing good, playfully slinging hair, that sis brought her. Pt. Laughing and talked about childhood, sister had long her she did and pt. Reports cutting sis hair because sister picked at her hair was shorter. Pt. Took in room and showed pictures of family. Pt. Very proud of family. Pt. Denies SHI, no AVH. Staff will monitor q24min for safety.

## 2011-12-21 ENCOUNTER — Inpatient Hospital Stay (HOSPITAL_COMMUNITY): Payer: PRIVATE HEALTH INSURANCE

## 2011-12-21 MED ORDER — DIVALPROEX SODIUM ER 500 MG PO TB24
500.0000 mg | ORAL_TABLET | Freq: Every day | ORAL | Status: DC
  Administered 2011-12-21: 500 mg via ORAL
  Filled 2011-12-21 (×4): qty 1

## 2011-12-21 NOTE — Discharge Planning (Signed)
Met with patient multiple times today while the potential 2-year treatment facility reviewed her information.  Initially this morning they stated "not to hope too much".  Plans were initiated to send patient home to live with father while she either awaited the [facility] bed or a [facility] bed (which would have to be scheduled).  Patient was tearful several times during the day because she does desperately want to go to [facility].  Received phone call from facility asking for additional information regarding the Zyprexa order.  Case Manager put together extensive history re medications orders and reasons for each throughout this hospital stay, faxed to facility.  Then talked to facility again and they do not want patient to be on any medications with sedative qualities.  CM explained that not only is the Zyprexa a sedating med, the Trazodone has not been addressed and it also is sedating.  They stated that they do not want to deny the patient, but she cannot come there on Zyprexa or Trazodone.  After discussion with Dr. Allena Katz, Case Manager spoke with patient who is willing to come off both those medications.  Doctor is going to examine possibilities, may try starting a low dose of another mood stabilizer, as patient does feel she will need something.  Case Manager did utilization review for additional days since there is going to be a medication change.  We will finalize all plans on Monday.  Ambrose Mantle, LCSW 12/21/2011, 5:26 PM

## 2011-12-21 NOTE — Progress Notes (Addendum)
Lacey Moses is seen OOB UAL on the 400 hall this morning...she presents to the med window first thing...takes all of her medications as ordered. She is bright. Cooperative and pleasant . She completed her self inventory this AM and on it she rated her depression and hopelessness  " 8 / 8 " and stated her DC -plan is to " be with family, go to church, stay clean, get a job, change people,places and things. POC cont  On wait list to go to [facility]. Safety is maintaiend and theraeputic relationship is fostered. PD RN [facility]

## 2011-12-21 NOTE — Progress Notes (Signed)
Lacey Moses is seen out in the milieu today...she went for an abd xray this evening ( due to her continued complaints of diarrhea...constipation, etc...) and xray showed  She has more stool in her lower colon..Her affect is more appropriate...she is pleasant and cooperative and takes her meds as ordered. She denies suicidal ideation, rates her depression and hopelessness as " 8 / 8 " and says she is feeling " much better". POC includes continuing to work with pt on bowel issues PDRN BC

## 2011-12-21 NOTE — Progress Notes (Signed)
[facility] MD Progress Note  12/21/2011 2:39 PM  Diagnosis:  Axis I: Major Depressive Disorder - Recurrent - Severe.  Alcohol Dependence.  Cocaine Abuse.  Cannabis Abuse.   The patient was seen today and reports the following:   ADL's: Intact.  Sleep: The patient reports to having some ongoing difficulty initiating and maintaining sleep.  Appetite: The patient reports a good appetite today.   Mild>(1-10) >Severe  Hopelessness (1-10): 7  Depression (1-10): 7  Anxiety (1-10): 7   Suicidal Ideation: The patient adamantly denies any suicidal ideations today.  Plan: No  Intent: No  Means: No   Homicidal Ideation: The patient adamantly denies any homicidal ideations today.  Plan: No  Intent: No.  Means: No   General Appearance/Behavior: The patient remains cooperative today with this provider.  Eye Contact: Good.  Speech: Appropriate in rate and volume with no pressuring noted today.  Motor Behavior: wnl.  Level of Consciousness: Alert and Oriented x 3.  Mental Status: Alert and Oriented x 3.  Mood: Appears moderately depressed today.  Affect: Appears moderately constricted.  Anxiety Level: Appears mildly anxious today.  Thought Process: wnl.  Thought Content: The patient denies any auditory or visual hallucinations today as well as any delusional thinking.  Perception:. wnl.  Judgment: Fair.  Insight: Fair.  Cognition: Oriented to person, place and time.  Sleep:  Number of Hours: 5    Vital Signs:Blood pressure 123/79, pulse 84, temperature 97 F (36.1 C), temperature source Oral, resp. rate 16, height 5\' 4"  (1.626 m), weight 78.019 kg (172 lb).  Current Medications: Current Facility-Administered Medications  Medication Dose Route Frequency Provider Last Rate Last Dose  . acetaminophen (TYLENOL) tablet 650 mg  650 mg Oral Q6H PRN Nehemiah Settle, MD   650 mg at 12/19/11 2145  . alum & mag hydroxide-simeth (MAALOX/MYLANTA) 200-200-20 MG/5ML suspension 30 mL  30 mL  Oral Q4H PRN Nehemiah Settle, MD      . bisacodyl (DULCOLAX) suppository 10 mg  10 mg Rectal QHS Laveda Norman, MD   10 mg at 12/16/11 2152  . camphor-menthol (SARNA) lotion   Topical PRN Wonda Cerise, MD      . diphenhydrAMINE (BENADRYL) capsule 50 mg  50 mg Oral Q6H PRN Verne Spurr, PA-C   50 mg at 12/15/11 1715  . efavirenz-emtrictabine-tenofovir (ATRIPLA) 600-200-300 MG per tablet 1 tablet  1 tablet Oral QHS Ronny Bacon, MD   1 tablet at 12/20/11 2147  . fluocinonide (LIDEX) 0.05 % external solution 1 application  1 application Topical BID Ronny Bacon, MD   1 application at 12/20/11 1850  . gabapentin (NEURONTIN) capsule 400 mg  400 mg Oral [id number] Curlene Labrum Karisma Meiser, MD   400 mg at 12/21/11 0740  . haloperidol (HALDOL) tablet 5 mg  5 mg Oral Q6H PRN Ronny Bacon, MD   5 mg at 12/14/11 1458  . hydrochlorothiazide (HYDRODIURIL) tablet 25 mg  25 mg Oral Daily Curlene Labrum Alistair Senft, MD   25 mg at 12/21/11 0740  . ibuprofen (ADVIL,MOTRIN) tablet 600 mg  600 mg Oral Q8H PRN Curlene Labrum Zailynn Brandel, MD   600 mg at 12/21/11 0743  . loperamide (IMODIUM) capsule 2 mg  2 mg Oral PRN Ronny Bacon, MD   2 mg at 12/21/11 0315  . LORazepam (ATIVAN) tablet 1 mg  1 mg Oral Q6H PRN Ronny Bacon, MD   1 mg at 12/15/11 2134  . magnesium citrate solution 1 Bottle  1 Bottle  Oral Once Curlene Labrum Cotton Beckley, MD      . magnesium hydroxide (MILK OF MAGNESIA) suspension 30 mL  30 mL Oral Daily PRN Nehemiah Settle, MD   30 mL at 12/16/11 0527  . mulitivitamin with minerals tablet 1 tablet  1 tablet Oral Daily Ronny Bacon, MD   1 tablet at 12/21/11 0739  . OLANZapine (ZYPREXA) tablet 10 mg  10 mg Oral QHS Curlene Labrum Kasey Hansell, MD   10 mg at 12/20/11 2147  . ondansetron (ZOFRAN-ODT) disintegrating tablet 4 mg  4 mg Oral Q8H PRN Nehemiah Settle, MD   4 mg at 12/19/11 0854  . polyethylene glycol (MIRALAX / GLYCOLAX) packet 17 g  17 g Oral BID Laveda Norman, MD   17 g at 12/18/11 1722  .  sertraline (ZOLOFT) tablet 150 mg  150 mg Oral Daily Curlene Labrum Sarahjane Matherly, MD   150 mg at 12/21/11 0740  . simethicone (MYLICON) chewable tablet 80 mg  80 mg Oral QID Laveda Norman, MD   80 mg at 12/21/11 0739  . simvastatin (ZOCOR) tablet 10 mg  10 mg Oral q1800 Verne Spurr, PA-C   10 mg at 12/20/11 1849  . sodium chloride (OCEAN) 0.65 % nasal spray 1 spray  1 spray Each Nare PRN Ronny Bacon, MD   1 spray at 12/16/11 0754  . sodium phosphate (FLEET) 7-19 GM/118ML enema 1 enema  1 enema Rectal BID PRN Viviann Spare, FNP   1 enema at 12/10/11 1352  . thiamine (VITAMIN B-1) tablet 100 mg  100 mg Oral Daily Alyson Kuroski-Mazzei, DO   100 mg at 12/21/11 0739  . traZODone (DESYREL) tablet 150 mg  150 mg Oral QHS PRN Wonda Cerise, MD   150 mg at 12/20/11 2149  . tretinoin (RETIN-A) 0.025 % cream 1 application  1 application Topical QHS Verne Spurr, PA-C   1 application at 12/20/11 2242  . valACYclovir (VALTREX) tablet 1,000 mg  1,000 mg Oral Daily Verne Spurr, PA-C   1,000 mg at 12/21/11 0740   Lab Results: No results found for this or any previous visit (from the past 48 hour(s)).  Review of Systems:  Neurological: The patient denies any headaches today. She denies any seizures or dizziness.  G.I.: The patient denies any constipation today but reports ongoing diarrhea with G.I. Upset today.  Musculoskeletal: The patient denies any muscle or skeletal difficulties.   Time was spent today discussing with the patient her current symptoms. The patient reports to having ongoing difficulty initiating and maintaining. She reports a good appetite and continues to report moderate feelings of sadness, anhedonia and depressed mood. She also is exhibiting ongoing mild to moderate anxiety. She adamantly denies any suicidal or homicidal ideations today and denies any auditory or visual hallucinations or delusional thinking today.   The patient states that she and her Father are continuing to work on a plan to  have her legal charges addressed where she can enter a treatment facility.  As of the writing of this note, we are still awaiting a decision regarding whether she can enter the [facility] facility.  Treatment Plan Summary:  1. Daily contact with patient to assess and evaluate symptoms and progress in treatment.  2. Medication management  3. The patient will deny suicidal ideations or homicidal ideations for 48 hours prior to discharge and have a depression and anxiety rating of 3 or less. The patient will also deny any auditory or visual hallucinations or delusional  thinking.  4. The patient will deny any symptoms of substance withdrawal at time of discharge.   Plan:  1. Will continue the medication Zoloft at 150 mgs po q am for depression and anxiety.  2. Will continue the medication Neurontin at 400 mgs po q am, 2 pm and hs for anxiety and mood stabilization.  3. Will continue the medication Zyprexa at 10 mgs po qhs for sleep and mood stabilization.  4. Will continue the medication Trazodone at 100 mgs po qhs - prn for sleep.  5. Will continue the patient on her non-psychiatric medications.  6. Laboratory studies reviewed.  7. Will continue to monitor.  8. Will continue the medication Imodium 2 mgs po q 2 hours - prn for diarrhea. 9. Will order a repeat abdominal x-ray to further evaluate her possible fecal impaction.  Dalma Panchal 12/21/2011, 2:39 PM

## 2011-12-21 NOTE — Progress Notes (Signed)
D.  Pt upset this evening because MHT removed her extra boxes of tissues and pads from her room.  Pt stated "nobody needs to touch my stuff, just leave it like it is, this is the way it has been since I have been here".  Pt pacing, loud, extremely agitated.  A.  Charge RN returned tissues and pads to Pt's room and Pt calmed down, took her medication as ordered.  Pt refused her dulcolax this evening stating "I don't take that anymore, I have diarrhea.  That should all be in my chart.  I need immodiam for diarrhea."  Support and encouragement offered. Explained to Pt that she is just cleaning out with her bowls.  Pt convinced she needs immodiam.  Denies SI/HI/hallucinations, states "I am just upset right now, they need to stop messing with my stuff".  R.  Pt calmed down once the items were returned, took night time medicaitons.  Will continue to monitor.

## 2011-12-21 NOTE — Progress Notes (Signed)
[facility] Group Notes:  (Counselor/Nursing/MHT/Case Management/Adjunct)  12/21/2011 5:30 PM  Type of Therapy:  Group Therapy at 11 and 1:15  Participation Level:  Did Not Attend   Clide Dales 12/21/2011, 5:30 PM

## 2011-12-21 NOTE — Progress Notes (Signed)
Patient ID: Lacey Moses, female   DOB: 1965-11-26, 46 y.o.   MRN: [id number] Pt. Went to [employer] said she enjoyed. Pt. Denies SHI. Pt. Watched TV and prepared for bed. Denies pain, still some diarrhea, no other complaints at this time. Staff will monitor q71min for safety.

## 2011-12-21 NOTE — Progress Notes (Signed)
12/21/2011         Time: 0930      Group Topic/Focus: The focus of the group is on enhancing the patients' ability to cope with stressors by understanding what coping is, why it is important, the negative effects of stress and developing healthier coping skills. Patients practice [place] and discuss how exercise can be used as a healthy coping strategy.   Participation Level: Active  Participation Quality: Appropriate and Attentive  Affect: Appropriate  Cognitive: Oriented  Additional Comments: Patient able to identify positive relaxation strategies she can use upon discharge, voiced frustration with some of her peers because she felt like what they were saying didn't make sense.  Beryle Zeitz 12/21/2011 10:09 AM

## 2011-12-21 NOTE — Tx Team (Signed)
Interdisciplinary Treatment Plan Update (Adult)  Date:  12/21/2011  Time Reviewed:  10:15AM-11:15AM  Progress in Treatment: Attending groups:  No Participating in groups:    No Taking medication as prescribed:   Yes  Tolerating medication:   Yes Family/Significant other contact made:  No Patient understands diagnosis:   Yes, fair insight, poor judgment Discussing patient identified problems/goals with staff:   Yes Medical problems stabilized or resolved:   Yes, although she continues to focus on somatic complaints Denies suicidal/homicidal ideation:  Yes Issues/concerns per patient self-inventory:   None Other:    New problem(s) identified: Yes, Describe:  it is unclear during Treatment Team whether patient will be accepted at [facility], as they are "not too hopeful".  Will find out by the end of the day, and this may necessitate patient adjusting her thoughts re discharge plans.  Reason for Continuation of Hospitalization: Other; describe Placement  Interventions implemented related to continuation of hospitalization:  Medication monitoring and adjustment, safety checks Q15 min., suicide risk assessment, group therapy, psychoeducation, collateral contact, aftercare planning, ongoing physician assessments, medication education  Additional comments:  Not applicable  Estimated length of stay:  0-2 days  Discharge Plan:  Either [facility] or home with father to wait for a bed at [facility] or [facility].  New goal(s):  Not applicable  Review of initial/current patient goals per problem list:   1.  Goal(s):  Deny SI for 48 hours prior to D/C.  Met:  Yes  Target date:  By Discharge   As evidenced by:  Has been denying  2.  Goal(s):  Decide if & how to address substance abuse issues at discharge.  Met:  Yes  Target date:  By Discharge   As evidenced by:  Would like to go to 2-yr treatment at [facility].  If cannot go there, will go to 28-day program at either  [facility] or [facility]  3.  Goal(s):  Reduce auditory and visual hallucinations to baseline per patient and collateral report  Met:  Yes  Target date:  By Discharge   As evidenced by:  Denies all AVH  4.  Goal(s):  Reduce depression to no greater than 3 at discharge.  Met:  No  Target date:  By Discharge   As evidenced by:  "7" is still reported by patient, but affect shows much less depression  Attendees: Patient:     Family:     Physician:  Dr. Harvie Heck Readling 12/21/2011 10:15AM-11:15AM  Nursing:    Omelia Blackwater, RN 12/21/2011 10:15AM -11:15AM   Case Manager:  Ambrose Mantle, LCSW 12/21/2011 10:15AM-11:15AM  Counselor:  Veto Kemps, MT-BC 12/21/2011 10:15AM-11:15AM  Other:   Leighton Parody, RN 12/21/2011 10:15AM-11:15AM  Other:   Shelda Jakes, RN 12/21/2011 10:15AM-11:15AM  Other:      Other:       Scribe for Treatment Team:   Sarina Ser, 12/21/2011, 10:15AM-11:15AM

## 2011-12-22 MED ORDER — DIVALPROEX SODIUM ER 500 MG PO TB24
750.0000 mg | ORAL_TABLET | Freq: Every day | ORAL | Status: DC
  Administered 2011-12-22 – 2011-12-23 (×2): 750 mg via ORAL
  Filled 2011-12-22 (×2): qty 1
  Filled 2011-12-22: qty 3
  Filled 2011-12-22: qty 1

## 2011-12-22 NOTE — Progress Notes (Signed)
[facility] Group Notes:  (Counselor/Nursing/MHT/Case Management/Adjunct)  12/22/2011 11 AM  Type of Therapy:  Aftercare Planning, Group Therapy, Dance/Movement Therapy   Participation Level:  Did not attend   Lacey Moses  

## 2011-12-22 NOTE — Progress Notes (Signed)
Patient ID: Lacey Moses, female   DOB: 01/29/1966, 46 y.o.   MRN: [id number]  [facility] Group Notes:  (Counselor/Nursing/MHT/Case Management/Adjunct)  12/22/2011 11 AM  Type of Therapy:  Aftercare Planning, Group Therapy, Dance/Movement Therapy   Participation Level:  Did Not Attend  Rhunette Croft

## 2011-12-22 NOTE — Progress Notes (Signed)
Patient ID: Lacey Moses, female   DOB: 1966/02/17, 46 y.o.   MRN: [id number] [facility] MD Progress Note  12/22/2011 11:36 AM 20  Diagnosis:  Axis I: Major Depressive Disorder - Recurrent - Severe.  Alcohol Dependence.  Cocaine Abuse.  Cannabis Abuse.   The patient was seen today and reports the following:   ADL's: Intact.  Sleep: The patient reports to having some ongoing difficulty initiating and maintaining sleep.  Appetite: The patient reports a good appetite today.   Mild>(1-10) >Severe  Hopelessness (1-10): does not answer Depression (1-10): 8-9/10 Anxiety (1-10): 8-9/10  Suicidal Ideation: The patient adamantly denies any suicidal ideations today.  Plan: No  Intent: No  Means: No   Homicidal Ideation: The patient adamantly denies any homicidal ideations today.  Plan: No  Intent: No.  Means: No   General Appearance/Behavior: The patient remains cooperative today with this provider.  Eye Contact: Good.  Speech: Appropriate in rate and volume with no pressuring noted today.  Motor Behavior: wnl.  Level of Consciousness: Alert  Mental Status:  Oriented x 3.  Mood: Appears moderately depressed today.  Affect: Appears moderately constricted.  Anxiety Level: Appears mildly anxious today.  Thought Process: wnl.  Thought Content: The patient denies any auditory or visual hallucinations today as well as any delusional thinking.  Perception:. wnl.  Judgment: Fair.  Insight: Fair.  Cognition: Oriented to person, place and time.  Sleep:  Number of Hours: 5.25    Vital Signs:Blood pressure 120/82, pulse 85, temperature 98.1 F (36.7 C), temperature source Oral, resp. rate 20, height 5\' 4"  (1.626 m), weight 78.019 kg (172 lb).  Current Medications: Current Facility-Administered Medications  Medication Dose Route Frequency Provider Last Rate Last Dose  . acetaminophen (TYLENOL) tablet 650 mg  650 mg Oral Q6H PRN Nehemiah Settle, MD   650 mg at 12/19/11 2145  . alum &  mag hydroxide-simeth (MAALOX/MYLANTA) 200-200-20 MG/5ML suspension 30 mL  30 mL Oral Q4H PRN Nehemiah Settle, MD      . bisacodyl (DULCOLAX) suppository 10 mg  10 mg Rectal QHS Laveda Norman, MD   10 mg at 12/16/11 2152  . camphor-menthol (SARNA) lotion   Topical PRN Wonda Cerise, MD      . diphenhydrAMINE (BENADRYL) capsule 50 mg  50 mg Oral Q6H PRN Verne Spurr, PA-C   50 mg at 12/21/11 2229  . divalproex (DEPAKOTE ER) 24 hr tablet 500 mg  500 mg Oral Q2200 Curlene Labrum Readling, MD   500 mg at 12/21/11 2043  . efavirenz-emtrictabine-tenofovir (ATRIPLA) 600-200-300 MG per tablet 1 tablet  1 tablet Oral QHS Ronny Bacon, MD   1 tablet at 12/21/11 2043  . fluocinonide (LIDEX) 0.05 % external solution 1 application  1 application Topical BID Ronny Bacon, MD   1 application at 12/21/11 1808  . gabapentin (NEURONTIN) capsule 400 mg  400 mg Oral [id number] Curlene Labrum Readling, MD   400 mg at 12/22/11 0841  . hydrochlorothiazide (HYDRODIURIL) tablet 25 mg  25 mg Oral Daily Curlene Labrum Readling, MD   25 mg at 12/22/11 0841  . ibuprofen (ADVIL,MOTRIN) tablet 600 mg  600 mg Oral Q8H PRN Curlene Labrum Readling, MD   600 mg at 12/21/11 0743  . magnesium citrate solution 1 Bottle  1 Bottle Oral Once Curlene Labrum Readling, MD      . magnesium hydroxide (MILK OF MAGNESIA) suspension 30 mL  30 mL Oral Daily PRN Nehemiah Settle, MD   30 mL at  12/16/11 0527  . mulitivitamin with minerals tablet 1 tablet  1 tablet Oral Daily Ronny Bacon, MD   1 tablet at 12/22/11 0841  . ondansetron (ZOFRAN-ODT) disintegrating tablet 4 mg  4 mg Oral Q8H PRN Nehemiah Settle, MD   4 mg at 12/19/11 0854  . polyethylene glycol (MIRALAX / GLYCOLAX) packet 17 g  17 g Oral BID Laveda Norman, MD   17 g at 12/18/11 1722  . sertraline (ZOLOFT) tablet 150 mg  150 mg Oral Daily Curlene Labrum Readling, MD   150 mg at 12/22/11 0840  . simethicone (MYLICON) chewable tablet 80 mg  80 mg Oral QID Laveda Norman, MD   80 mg at 12/22/11 0844  .  simvastatin (ZOCOR) tablet 10 mg  10 mg Oral q1800 Verne Spurr, PA-C   10 mg at 12/21/11 1811  . sodium chloride (OCEAN) 0.65 % nasal spray 1 spray  1 spray Each Nare PRN Ronny Bacon, MD   1 spray at 12/16/11 0754  . sodium phosphate (FLEET) 7-19 GM/118ML enema 1 enema  1 enema Rectal BID PRN Viviann Spare, FNP   1 enema at 12/10/11 1352  . thiamine (VITAMIN B-1) tablet 100 mg  100 mg Oral Daily Alyson Kuroski-Mazzei, DO   100 mg at 12/22/11 0841  . tretinoin (RETIN-A) 0.025 % cream 1 application  1 application Topical QHS Verne Spurr, PA-C   1 application at 12/20/11 2242  . valACYclovir (VALTREX) tablet 1,000 mg  1,000 mg Oral Daily Verne Spurr, PA-C   1,000 mg at 12/22/11 0841  . DISCONTD: haloperidol (HALDOL) tablet 5 mg  5 mg Oral Q6H PRN Curlene Labrum Readling, MD   5 mg at 12/14/11 1458  . DISCONTD: loperamide (IMODIUM) capsule 2 mg  2 mg Oral PRN Ronny Bacon, MD   2 mg at 12/21/11 2229  . DISCONTD: LORazepam (ATIVAN) tablet 1 mg  1 mg Oral Q6H PRN Ronny Bacon, MD   1 mg at 12/15/11 2134  . DISCONTD: OLANZapine (ZYPREXA) tablet 10 mg  10 mg Oral QHS Curlene Labrum Readling, MD   10 mg at 12/20/11 2147  . DISCONTD: traZODone (DESYREL) tablet 150 mg  150 mg Oral QHS PRN Wonda Cerise, MD   150 mg at 12/20/11 2149   Lab Results:Abd film: moderate stool burden in the ascending colon. No obstructions.  Review of Systems:  Neurological: The patient denies any headaches today. She denies any seizures or dizziness.  G.I.: The patient denies any constipation today but reports ongoing diarrhea with G.I. Upset today.  Musculoskeletal: The patient denies any muscle or skeletal difficulties.   Time was spent today discussing with the patient her current symptoms. The patient reports to having ongoing difficulty initiating and maintaining sleep.       She is also fixated on her bowel habits and now complains of loose watery stools which she is calling diarrhea.  Patient is educated that she  still has a large stool burden per the recent abdominal exray.  She is informed that she will not be getting any further Immodium, that she will need to move her bowels as often as she is feeling the urge to do so.  She has had a number of staff members to observe her BMs.  She is again educated that she will not get any further meds to stop her bowel movements.      I will evaluate her depakote level to see if there can be an  increase to help with her initiation of sleep at night as well. Treatment Plan Summary:  1. Daily contact with patient to assess and evaluate symptoms and progress in treatment.  2. Medication management  3. The patient will deny suicidal ideations or homicidal ideations for 48 hours prior to discharge and have a depression and anxiety rating of 3 or less. The patient will also deny any auditory or visual hallucinations or delusional thinking.  4. The patient will deny any symptoms of substance withdrawal at time of discharge.   Plan:  1. Will continue the medication Zoloft at 150 mgs po q am for depression and anxiety.  2. Will continue the medication Neurontin at 400 mgs po q am, 2 pm and hs for anxiety and mood stabilization.  3. Will continue the medication Zyprexa at 10 mgs po qhs for sleep and mood stabilization.  4. Will continue the medication Trazodone at 100 mgs po qhs - prn for sleep.  5. Will continue the patient on her non-psychiatric medications.  6. Laboratory studies reviewed.  7. Will continue to monitor.  8. Will discontinue all anti diarrheals. 9. XRAY is reviewed and she is not obstructed. 10. Will increase depakote to 750mg  for mood stabilization and sleep. 11. Labs to be drawn Sunday night. CBC, CMP Depakote level. Wanda Cellucci 12/22/2011, 11:36 AM

## 2011-12-22 NOTE — Progress Notes (Signed)
Patient has been active on the unit. Denies SI. Rates depression/hopelessness at 8. Appears depressed. Is currently on the waiting list for [facility]. Continues to report poor sleep despite taking trazodone. She spoke with the PA regarding her concerns today. Her discharge plan which patient wrote on her self inventory is "Go to Church,stay clean, be with family, change people, places, and things." Staff report that patient has been hoarding items in her room which have been removed during environmental checks. Patient expressed irritation with this on her self inventory. Patient received many kinds of laxatives for documented constipation but not is expressing concern over loose bowel movements. Patient given support and reassurance. Remains safe on the unit.

## 2011-12-23 LAB — DIFFERENTIAL
Basophils Absolute: 0 10*3/uL (ref 0.0–0.1)
Basophils Relative: 1 % (ref 0–1)
Eosinophils Absolute: 0.1 10*3/uL (ref 0.0–0.7)
Eosinophils Relative: 4 % (ref 0–5)
Lymphocytes Relative: 47 % — ABNORMAL HIGH (ref 12–46)
Lymphs Abs: 1.6 10*3/uL (ref 0.7–4.0)
Monocytes Absolute: 0.4 10*3/uL (ref 0.1–1.0)
Monocytes Relative: 11 % (ref 3–12)
Neutro Abs: 1.3 10*3/uL — ABNORMAL LOW (ref 1.7–7.7)
Neutrophils Relative %: 38 % — ABNORMAL LOW (ref 43–77)

## 2011-12-23 LAB — CBC
HCT: 43.4 % (ref 36.0–46.0)
Hemoglobin: 14.3 g/dL (ref 12.0–15.0)
MCH: 30.2 pg (ref 26.0–34.0)
MCHC: 32.9 g/dL (ref 30.0–36.0)
MCV: 91.8 fL (ref 78.0–100.0)
Platelets: 308 10*3/uL (ref 150–400)
RBC: 4.73 MIL/uL (ref 3.87–5.11)
RDW: 13.7 % (ref 11.5–15.5)
WBC: 3.4 10*3/uL — ABNORMAL LOW (ref 4.0–10.5)

## 2011-12-23 LAB — COMPREHENSIVE METABOLIC PANEL
ALT: 51 U/L — ABNORMAL HIGH (ref 0–35)
AST: 50 U/L — ABNORMAL HIGH (ref 0–37)
Albumin: 3.5 g/dL (ref 3.5–5.2)
Alkaline Phosphatase: 87 U/L (ref 39–117)
BUN: 10 mg/dL (ref 6–23)
CO2: 26 mEq/L (ref 19–32)
Calcium: 9.3 mg/dL (ref 8.4–10.5)
Chloride: 101 mEq/L (ref 96–112)
Creatinine, Ser: 0.74 mg/dL (ref 0.50–1.10)
GFR calc Af Amer: >90 mL/min (ref >90)
GFR calc non Af Amer: >90 mL/min (ref >90)
Glucose, Bld: 133 mg/dL — ABNORMAL HIGH (ref 70–99)
Potassium: 3.9 mEq/L (ref 3.5–5.1)
Sodium: 137 mEq/L (ref 135–145)
Total Bilirubin: 0.2 mg/dL — ABNORMAL LOW (ref 0.3–1.2)
Total Protein: 7.1 g/dL (ref 6.0–8.3)

## 2011-12-23 LAB — VALPROIC ACID LEVEL: Valproic Acid Lvl: 27 ug/mL — ABNORMAL LOW (ref 50.0–100.0)

## 2011-12-23 MED ORDER — GERHARDT'S BUTT CREAM
TOPICAL_CREAM | Freq: Four times a day (QID) | CUTANEOUS | Status: DC
  Administered 2011-12-24 – 2011-12-26 (×9): via TOPICAL
  Filled 2011-12-23 (×2): qty 1

## 2011-12-23 MED ORDER — CALCIUM POLYCARBOPHIL 625 MG PO TABS
625.0000 mg | ORAL_TABLET | Freq: Every day | ORAL | Status: DC
  Administered 2011-12-24 – 2011-12-27 (×4): 625 mg via ORAL
  Filled 2011-12-23 (×8): qty 1

## 2011-12-23 NOTE — Progress Notes (Signed)
Patient ID: Lacey Moses, female   DOB: Oct 02, 1965, 46 y.o.   MRN: [id number]  [facility] Group Notes:  (Counselor/Nursing/MHT/Case Management/Adjunct)  12/23/2011 11 AM  Type of Therapy:  Aftercare Planning, Group Therapy, Dance/Movement Therapy   Participation Level:  Did Not Attend   Rhunette Croft

## 2011-12-23 NOTE — Progress Notes (Signed)
Patient ID: Bitania Shankland, female   DOB: 06-Apr-1966, 46 y.o.   MRN: [id number] [facility] MD Progress Note  12/23/2011 3:35 PM 21  Diagnosis:  Axis I: Major Depressive Disorder - Recurrent - Severe.  Alcohol Dependence.  Cocaine Abuse.  Cannabis Abuse.   The patient was seen today and reports the following:   ADL's: Intact.  Sleep: The patient reports to having some ongoing difficulty initiating and maintaining sleep.  Appetite: The patient reports a good appetite today.   Mild>(1-10) >Severe  Hopelessness (1-10): 5/10 worries about [facility]. Depression (1-10): 8-9/10 Anxiety (1-10): 8-9/10  Suicidal Ideation: The patient adamantly denies any suicidal ideations today.  Plan: No  Intent: No  Means: No   Homicidal Ideation: The patient adamantly denies any homicidal ideations today.  Plan: No  Intent: No.  Means: No   General Appearance/Behavior: The patient remains cooperative today with this provider.  Eye Contact: Good.  Speech: Appropriate in rate and volume with no pressuring noted today.  Motor Behavior: wnl.  Level of Consciousness: Alert  Mental Status:  Oriented x 3.  Mood: Appears moderately depressed today.  Affect: Appears moderately constricted.  Anxiety Level: Appears mildly anxious today.  Thought Process: wnl.  Thought Content: The patient denies any auditory or visual hallucinations today as well as any delusional thinking.  Perception:. wnl.  Judgment: Fair.  Insight: Fair.  Cognition: Oriented to person, place and time.  Sleep:  Number of Hours: 5    Vital Signs:(06/16 0819) Temp:  [97.6 F (36.4 C)] 97.6 F (36.4 C) Pulse Rate:  [73-81] 81  Resp:  [16] 16 BP: (113-115)/(80-82) 113/82 mmHg   Current Medications: Current Facility-Administered Medications  Medication Dose Route Frequency Provider Last Rate Last Dose  . acetaminophen (TYLENOL) tablet 650 mg  650 mg Oral Q6H PRN Nehemiah Settle, MD   650 mg at 12/19/11 2145  . alum &  mag hydroxide-simeth (MAALOX/MYLANTA) 200-200-20 MG/5ML suspension 30 mL  30 mL Oral Q4H PRN Nehemiah Settle, MD      . bisacodyl (DULCOLAX) suppository 10 mg  10 mg Rectal QHS Laveda Norman, MD   10 mg at 12/16/11 2152  . camphor-menthol (SARNA) lotion   Topical PRN Wonda Cerise, MD      . diphenhydrAMINE (BENADRYL) capsule 50 mg  50 mg Oral Q6H PRN Verne Spurr, PA-C   50 mg at 12/21/11 2229  . divalproex (DEPAKOTE ER) 24 hr tablet 500 mg  500 mg Oral Q2200 Curlene Labrum Readling, MD   500 mg at 12/21/11 2043  . efavirenz-emtrictabine-tenofovir (ATRIPLA) 600-200-300 MG per tablet 1 tablet  1 tablet Oral QHS Ronny Bacon, MD   1 tablet at 12/21/11 2043  . fluocinonide (LIDEX) 0.05 % external solution 1 application  1 application Topical BID Ronny Bacon, MD   1 application at 12/21/11 1808  . gabapentin (NEURONTIN) capsule 400 mg  400 mg Oral [id number] Curlene Labrum Readling, MD   400 mg at 12/22/11 0841  . hydrochlorothiazide (HYDRODIURIL) tablet 25 mg  25 mg Oral Daily Curlene Labrum Readling, MD   25 mg at 12/22/11 0841  . ibuprofen (ADVIL,MOTRIN) tablet 600 mg  600 mg Oral Q8H PRN Curlene Labrum Readling, MD   600 mg at 12/21/11 0743  . magnesium citrate solution 1 Bottle  1 Bottle Oral Once Curlene Labrum Readling, MD      . magnesium hydroxide (MILK OF MAGNESIA) suspension 30 mL  30 mL Oral Daily PRN Nehemiah Settle, MD  30 mL at 12/16/11 0527  . mulitivitamin with minerals tablet 1 tablet  1 tablet Oral Daily Ronny Bacon, MD   1 tablet at 12/22/11 0841  . ondansetron (ZOFRAN-ODT) disintegrating tablet 4 mg  4 mg Oral Q8H PRN Nehemiah Settle, MD   4 mg at 12/19/11 0854  . polyethylene glycol (MIRALAX / GLYCOLAX) packet 17 g  17 g Oral BID Laveda Norman, MD   17 g at 12/18/11 1722  . sertraline (ZOLOFT) tablet 150 mg  150 mg Oral Daily Curlene Labrum Readling, MD   150 mg at 12/22/11 0840  . simethicone (MYLICON) chewable tablet 80 mg  80 mg Oral QID Laveda Norman, MD   80 mg at 12/22/11 0844  .  simvastatin (ZOCOR) tablet 10 mg  10 mg Oral q1800 Verne Spurr, PA-C   10 mg at 12/21/11 1811  . sodium chloride (OCEAN) 0.65 % nasal spray 1 spray  1 spray Each Nare PRN Ronny Bacon, MD   1 spray at 12/16/11 0754  . sodium phosphate (FLEET) 7-19 GM/118ML enema 1 enema  1 enema Rectal BID PRN Viviann Spare, FNP   1 enema at 12/10/11 1352  . thiamine (VITAMIN B-1) tablet 100 mg  100 mg Oral Daily Alyson Kuroski-Mazzei, DO   100 mg at 12/22/11 0841  . tretinoin (RETIN-A) 0.025 % cream 1 application  1 application Topical QHS Verne Spurr, PA-C   1 application at 12/20/11 2242  . valACYclovir (VALTREX) tablet 1,000 mg  1,000 mg Oral Daily Verne Spurr, PA-C   1,000 mg at 12/22/11 0841  . DISCONTD: haloperidol (HALDOL) tablet 5 mg  5 mg Oral Q6H PRN Curlene Labrum Readling, MD   5 mg at 12/14/11 1458  . DISCONTD: loperamide (IMODIUM) capsule 2 mg  2 mg Oral PRN Ronny Bacon, MD   2 mg at 12/21/11 2229  . DISCONTD: LORazepam (ATIVAN) tablet 1 mg  1 mg Oral Q6H PRN Ronny Bacon, MD   1 mg at 12/15/11 2134  . DISCONTD: OLANZapine (ZYPREXA) tablet 10 mg  10 mg Oral QHS Curlene Labrum Readling, MD   10 mg at 12/20/11 2147  . DISCONTD: traZODone (DESYREL) tablet 150 mg  150 mg Oral QHS PRN Wonda Cerise, MD   150 mg at 12/20/11 2149   Lab Results:Abd film: moderate stool burden in the ascending colon. No obstructions.  Review of Systems:  Neurological: The patient denies any headaches today. She denies any seizures or dizziness.  G.I.: The patient denies any constipation today but reports ongoing diarrhea with G.I. Upset today.  Musculoskeletal: The patient denies any muscle or skeletal difficulties.   Time was spent today discussing with the patient her current symptoms. The patient reports to having ongoing difficulty initiating and maintaining sleep. She does appear somewhat drowsy.  And again, she is fixated on her bowel habits and states she is obstructed.  Once again she is requesting Something  for her diarrhea.  Once again she is educated that she is NOT obstructed, diagram is drawn again to explain "stool burden".  She is educated that she will not be getting any further laxitives as she is passing stool and flatus several times a day.  She will not be getting any antidiarrheals as she does need to cleanse her colon.  She is instructed to eat a high fiber diet, and avoid only eating fruits as that causes diarrhea.  She will be given Fiber Con to firm up her stools and  absorb some of the water in her colon to relieve the "watery effect" of so many purgatives that she has been given.  She will also be ordered Zinc oxide ointment for rectal irritation.  Also educated patient that sedatives often cause constipation and we would like to avoid this.   Treatment Plan Summary:  1. Daily contact with patient to assess and evaluate symptoms and progress in treatment.  2. Medication management  3. The patient will deny suicidal ideations or homicidal ideations for 48 hours prior to discharge and have a depression and anxiety rating of 3 or less. The patient will also deny any auditory or visual hallucinations or delusional thinking.  4. The patient will deny any symptoms of substance withdrawal at time of discharge.   Plan:  1. Will continue the medication Zoloft at 150 mgs po q am for depression and anxiety.  2. Will continue the medication Neurontin at 400 mgs po q am, 2 pm and hs for anxiety and mood stabilization.  3. Will continue the medication Zyprexa at 10 mgs po qhs for sleep and mood stabilization.  4. Will continue the medication Trazodone at 100 mgs po qhs - prn for sleep.  5. Will continue the patient on her non-psychiatric medications.  6. Laboratory studies pending 7. Will continue to monitor.  8. Will start Fiber Con two tabs po qd for regularity. 9. Will continue depakote to 750mg  for mood stabilization and sleep. 10.Labs to be drawn Sunday night. CBC, CMP Depakote  level. 11. Zinc Oxide ointment to rectal area to relieve irritation prn. Prerna Harold 12/23/2011, 3:35 PM

## 2011-12-23 NOTE — Progress Notes (Signed)
D: Pt has been in room much of the day, minimal participation in various activities. Pt complained today of not being able to sleep adequately, pt did endorse having some feelings of depression and anxiety today but denied any suicidal thoughts, pt did receive medications today. A: Pt provided with education regarding bowel patterns, provided with support and encouragement. R: Pt has remained largely isolative today, limited interaction in the milieu.

## 2011-12-23 NOTE — Progress Notes (Signed)
Pt is lying in bed with eyes closed resting. Pt respirations even and unlabored. Pt appears to be in no signs of distress at this time. Pt safety remains with q15min checks  

## 2011-12-24 MED ORDER — OLANZAPINE 5 MG PO TBDP
5.0000 mg | ORAL_TABLET | Freq: Four times a day (QID) | ORAL | Status: DC | PRN
  Administered 2011-12-25: 5 mg via ORAL
  Filled 2011-12-24: qty 1

## 2011-12-24 MED ORDER — HYDROXYZINE HCL 50 MG PO TABS
50.0000 mg | ORAL_TABLET | Freq: Every evening | ORAL | Status: DC | PRN
  Administered 2011-12-24: 50 mg via ORAL
  Filled 2011-12-24: qty 1

## 2011-12-24 MED ORDER — EFAVIRENZ-EMTRICITAB-TENOFOVIR 600-200-300 MG PO TABS
1.0000 | ORAL_TABLET | Freq: Every day | ORAL | Status: DC
  Administered 2011-12-24: 1 via ORAL

## 2011-12-24 MED ORDER — DIVALPROEX SODIUM ER 500 MG PO TB24
1000.0000 mg | ORAL_TABLET | Freq: Every day | ORAL | Status: DC
  Administered 2011-12-24 – 2011-12-26 (×3): 1000 mg via ORAL
  Filled 2011-12-24: qty 2
  Filled 2011-12-24: qty 28
  Filled 2011-12-24 (×3): qty 2

## 2011-12-24 MED ORDER — EFAVIRENZ-EMTRICITAB-TENOFOVIR 600-200-300 MG PO TABS
1.0000 | ORAL_TABLET | Freq: Every day | ORAL | Status: DC
  Administered 2011-12-25 – 2011-12-27 (×3): 1 via ORAL
  Filled 2011-12-24: qty 1
  Filled 2011-12-24: qty 14
  Filled 2011-12-24 (×5): qty 1

## 2011-12-24 NOTE — Discharge Planning (Signed)
Met with patient in her room.  She was irritable and agitated, and complained that she was once again supposed to receive enemas instead of going to the hospital to have her stomach issues addressed.  She stated she has not slept in 5 nights (it has only been 3 since the Trazodone was stopped).  She said she does not know if she is irritable because the Zyprexa was stopped or because of the Depakote starting.  Patient complained at length about the medication changes, but then Case Manager pointed out that each step of the way, these have been changed either in reaction to her symptoms or because she cannot be on them at the treatment facility she herself has chosen.  She did calm down and admit this to be the case.  Case Manager spoke with [facility] to inform them of the changes in medications away from Zyprexa and Trazodone.  At their request, sent the current MAR.  They think we will have an answer from them tomorrow, and if accepted, the patient will be sent a letter immediately.  Case Manager prepared this facility's letter for her as well.  Ambrose Mantle, LCSW 12/24/2011, 2:46 PM

## 2011-12-24 NOTE — Progress Notes (Signed)
[facility] Group Notes:  (Counselor/Nursing/MHT/Case Management/Adjunct)  12/24/2011 12:15 PM  Type of Therapy: Group Therapy   Participation Level: Very Minimal   Participation Quality: Minimal  Affect: Blunted   Cognitive: oriented, alert   Insight: minimal  Engagement in Group: Limited  Modes of Intervention: Clarification, Education, Problem-solving, Socialization, Activity, Encouragement and Support   Summary of Progress/Problems: Pt was in and out of group.  She only actiively participated in the Positive Affirmations exercise participated in group by listening attentively and self disclosing.  Therapist prompted clients to identify successful methods they had used to manage anger, depression, insomnia and voices.  Therapy was focused on developing positive coping skills and increasing self esteem. Therapist encouraged Pts to express feelings.  Therapist explained the affirmations exercise and Pt gave positive affirmations to another Pt.  Pt was open to feedback from others.  Therapist offered support and encouragement.  Slight progress noted.          Lacey Moses 12/24/2011, 12:15 PM

## 2011-12-24 NOTE — Progress Notes (Signed)
Patient ID: Lacey Moses, female   DOB: 1966-06-30, 46 y.o.   MRN: [id number] D-Pt has been complaining of diarrhea and constipation.  She has been feeling agitated about this.  X-ray showed some obstruction.   A-Gave pt a soapsuds enema per MD order.  R- Pt has had results and says she feels there is more there. While still in bathroom asked when she  would be getting another one.  Urged pt to continue to wait for more results from this one.  Pt earlier described her mood as agitated and attributes this to bowel issues.  Pt is hoping to go to a two year  Drug program.  Pt also says she is "OCD" but "when I took medication for it , it got worse."  Pt has everything in her room in orderly columns and labeled.  She is also frustrated by her poor sleep.  MD is aware.

## 2011-12-24 NOTE — Progress Notes (Signed)
[facility] MD Progress Note  12/24/2011 1:07 PM  Diagnosis:  Axis I: Major Depressive Disorder - Recurrent - Severe.  Alcohol Dependence.  Cocaine Abuse.  Cannabis Abuse.   The patient was seen today and reports the following:   ADL's: Intact.  Sleep: The patient reports to having some ongoing difficulty initiating and maintaining sleep.  Appetite: The patient reports a good appetite today.   Mild>(1-10) >Severe  Hopelessness (1-10): 9  Depression (1-10): 9  Anxiety (1-10): 9   Suicidal Ideation: The patient adamantly denies any suicidal ideations today.  Plan: No  Intent: No  Means: No   Homicidal Ideation: The patient adamantly denies any homicidal ideations today.  Plan: No  Intent: No.  Means: No   General Appearance/Behavior: The patient remains cooperative today with this provider.  Eye Contact: Good.  Speech: Appropriate in rate and volume with no pressuring noted today.  Motor Behavior: wnl.  Level of Consciousness: Alert and Oriented x 3.  Mental Status: Alert and Oriented x 3.  Mood: Appears moderately depressed today.  Affect: Appears moderately constricted.  Anxiety Level: Appears moderately anxious today.  Thought Process: wnl.  Thought Content: The patient denies any auditory or visual hallucinations today as well as any delusional thinking.  Perception:. wnl.  Judgment: Fair.  Insight: Fair.  Cognition: Oriented to person, place and time.  Sleep:  Number of Hours: 2    Vital Signs:Blood pressure 119/85, pulse 87, temperature 98.7 F (37.1 C), temperature source Oral, resp. rate 18, height 5\' 4"  (1.626 m), weight 78.019 kg (172 lb).  Current Medications: Current Facility-Administered Medications  Medication Dose Route Frequency Provider Last Rate Last Dose  . acetaminophen (TYLENOL) tablet 650 mg  650 mg Oral Q6H PRN Nehemiah Settle, MD   650 mg at 12/19/11 2145  . alum & mag hydroxide-simeth (MAALOX/MYLANTA) 200-200-20 MG/5ML suspension 30 mL   30 mL Oral Q4H PRN Nehemiah Settle, MD      . bisacodyl (DULCOLAX) suppository 10 mg  10 mg Rectal QHS Curlene Labrum Valerio Pinard, MD   10 mg at 12/16/11 2152  . camphor-menthol (SARNA) lotion   Topical PRN Wonda Cerise, MD      . diphenhydrAMINE (BENADRYL) capsule 50 mg  50 mg Oral Q6H PRN Verne Spurr, PA-C   50 mg at 12/24/11 0253  . divalproex (DEPAKOTE ER) 24 hr tablet 1,000 mg  1,000 mg Oral Q2200 Curlene Labrum Ceasia Elwell, MD      . efavirenz-emtrictabine-tenofovir (ATRIPLA) 600-200-300 MG per tablet 1 tablet  1 tablet Oral Q breakfast Curlene Labrum Celvin Taney, MD      . fluocinonide (LIDEX) 0.05 % external solution 1 application  1 application Topical BID Ronny Bacon, MD   1 application at 12/24/11 0817  . gabapentin (NEURONTIN) capsule 400 mg  400 mg Oral [id number] Curlene Labrum Patric Buckhalter, MD   400 mg at 12/24/11 0816  . Gerhardt's butt cream   Topical QID Verne Spurr, PA-C      . hydrochlorothiazide (HYDRODIURIL) tablet 25 mg  25 mg Oral Daily Curlene Labrum Blaize Epple, MD   25 mg at 12/24/11 0817  . ibuprofen (ADVIL,MOTRIN) tablet 600 mg  600 mg Oral Q8H PRN Ronny Bacon, MD   600 mg at 12/23/11 2201  . magnesium hydroxide (MILK OF MAGNESIA) suspension 30 mL  30 mL Oral Daily PRN Nehemiah Settle, MD   30 mL at 12/16/11 0527  . mulitivitamin with minerals tablet 1 tablet  1 tablet Oral Daily Ronny Bacon, MD  1 tablet at 12/24/11 0815  . ondansetron (ZOFRAN-ODT) disintegrating tablet 4 mg  4 mg Oral Q8H PRN Nehemiah Settle, MD   4 mg at 12/19/11 0854  . polycarbophil (FIBERCON) tablet 625 mg  625 mg Oral Daily Verne Spurr, PA-C   625 mg at 12/24/11 0816  . polyethylene glycol (MIRALAX / GLYCOLAX) packet 17 g  17 g Oral BID Laveda Norman, MD   17 g at 12/18/11 1722  . sertraline (ZOLOFT) tablet 150 mg  150 mg Oral Daily Curlene Labrum Rahkim Rabalais, MD   150 mg at 12/24/11 0816  . simethicone (MYLICON) chewable tablet 80 mg  80 mg Oral QID Laveda Norman, MD   80 mg at 12/24/11 1201  . simvastatin  (ZOCOR) tablet 10 mg  10 mg Oral q1800 Verne Spurr, PA-C   10 mg at 12/23/11 1845  . sodium chloride (OCEAN) 0.65 % nasal spray 1 spray  1 spray Each Nare PRN Ronny Bacon, MD   1 spray at 12/16/11 0754  . sodium phosphate (FLEET) 7-19 GM/118ML enema 1 enema  1 enema Rectal BID PRN Viviann Spare, FNP   1 enema at 12/10/11 1352  . thiamine (VITAMIN B-1) tablet 100 mg  100 mg Oral Daily Alyson Kuroski-Mazzei, DO   100 mg at 12/24/11 0815  . tretinoin (RETIN-A) 0.025 % cream 1 application  1 application Topical QHS Verne Spurr, PA-C   1 application at 12/20/11 2242  . valACYclovir (VALTREX) tablet 1,000 mg  1,000 mg Oral Daily Verne Spurr, PA-C   1,000 mg at 12/24/11 0817  . DISCONTD: divalproex (DEPAKOTE ER) 24 hr tablet 750 mg  750 mg Oral Q2200 Verne Spurr, PA-C   750 mg at 12/23/11 2118  . DISCONTD: efavirenz-emtrictabine-tenofovir (ATRIPLA) 600-200-300 MG per tablet 1 tablet  1 tablet Oral QHS Ronny Bacon, MD   1 tablet at 12/23/11 2118  . DISCONTD: magnesium citrate solution 1 Bottle  1 Bottle Oral Once Ronny Bacon, MD       Lab Results:  Results for orders placed during the hospital encounter of 12/02/11 (from the past 48 hour(s))  COMPREHENSIVE METABOLIC PANEL     Status: Abnormal   Collection Time   12/23/11  7:31 PM      Component Value Range Comment   Sodium 137  135 - 145 mEq/L    Potassium 3.9  3.5 - 5.1 mEq/L    Chloride 101  96 - 112 mEq/L    CO2 26  19 - 32 mEq/L    Glucose, Bld 133 (*) 70 - 99 mg/dL    BUN 10  6 - 23 mg/dL    Creatinine, Ser 9.56  0.50 - 1.10 mg/dL    Calcium 9.3  8.4 - 21.3 mg/dL    Total Protein 7.1  6.0 - 8.3 g/dL    Albumin 3.5  3.5 - 5.2 g/dL    AST 50 (*) 0 - 37 U/L    ALT 51 (*) 0 - 35 U/L    Alkaline Phosphatase 87  39 - 117 U/L    Total Bilirubin 0.2 (*) 0.3 - 1.2 mg/dL    GFR calc non Af Amer >90  >90 mL/min    GFR calc Af Amer >90  >90 mL/min   CBC     Status: Abnormal   Collection Time   12/23/11  7:31 PM       Component Value Range Comment   WBC 3.4 (*) 4.0 - 10.5 K/uL  RBC 4.73  3.87 - 5.11 MIL/uL    Hemoglobin 14.3  12.0 - 15.0 g/dL    HCT 52.8  41.3 - 24.4 %    MCV 91.8  78.0 - 100.0 fL    MCH 30.2  26.0 - 34.0 pg    MCHC 32.9  30.0 - 36.0 g/dL    RDW 01.0  27.2 - 53.6 %    Platelets 308  150 - 400 K/uL   DIFFERENTIAL     Status: Abnormal   Collection Time   12/23/11  7:31 PM      Component Value Range Comment   Neutrophils Relative 38 (*) 43 - 77 %    Neutro Abs 1.3 (*) 1.7 - 7.7 K/uL    Lymphocytes Relative 47 (*) 12 - 46 %    Lymphs Abs 1.6  0.7 - 4.0 K/uL    Monocytes Relative 11  3 - 12 %    Monocytes Absolute 0.4  0.1 - 1.0 K/uL    Eosinophils Relative 4  0 - 5 %    Eosinophils Absolute 0.1  0.0 - 0.7 K/uL    Basophils Relative 1  0 - 1 %    Basophils Absolute 0.0  0.0 - 0.1 K/uL   VALPROIC ACID LEVEL     Status: Abnormal   Collection Time   12/23/11  7:31 PM      Component Value Range Comment   Valproic Acid Lvl 27.0 (*) 50.0 - 100.0 ug/mL    Review of Systems:  Neurological: The patient denies any headaches today. She denies any seizures or dizziness.  G.I.: The patient once again reports constipation today but with no G.I. Upset today.  Musculoskeletal: The patient denies any muscle or skeletal difficulties.   Time was spent today discussing with the patient her current symptoms. The patient reports to having ongoing difficulty initiating and maintaining. She reports a good appetite and continues to display moderate feelings of sadness, anhedonia and depressed mood. She also is exhibiting ongoing mild to moderate anxiety. She adamantly denies any suicidal or homicidal ideations today as well as any auditory or visual hallucinations or delusional thinking today.   The patient states that she and her Father are continuing to work on a plan to have her legal charges addressed where she can enter a treatment facility. She states this may be completed by tomorrow where discharge  is possible.  She also continues to express concerns about her constipation.  Treatment Plan Summary:  1. Daily contact with patient to assess and evaluate symptoms and progress in treatment.  2. Medication management  3. The patient will deny suicidal ideations or homicidal ideations for 48 hours prior to discharge and have a depression and anxiety rating of 3 or less. The patient will also deny any auditory or visual hallucinations or delusional thinking.  4. The patient will deny any symptoms of substance withdrawal at time of discharge.   Plan:  1. Will continue the medication Zoloft at 150 mgs po q am for depression and anxiety.  2. Will continue the medication Neurontin at 400 mgs po q am, 2 pm and hs for anxiety and mood stabilization.  3. Will continue the patient on her non-psychiatric medications.  4. Will increase the medication Depakote ER to 1000 mgs po qhs for mood stabilization. 5. Will continue the medication Neurontin 400 mgs po q am, 2 pm and hs for mood stabilization. 6. Will order Vistaril 50 mgs po qhs - prn for sleep.  The patient is aware that she cannot take this medication while at [facility]. 7. Laboratory studies reviewed.  8. Will order another soap suds enema for the patient's constipation. 9. Will continue to monitor.    Blaire Hodsdon 12/24/2011, 1:07 PM

## 2011-12-24 NOTE — Progress Notes (Signed)
Patient still up in her room, arranging stuff. She said she received Vistaril few minutes ago but, it hasn't kicked in. Her mood and affect appropriate. Writer told patient to notified her if unable to sleep after an hour. Patient receptive to advice. Q 15 minute check continues to maintain safety.

## 2011-12-25 LAB — CD4/CD8 (T-HELPER/T-SUPPRESSOR CELL)
CD4 absolute: 590 /uL (ref 500–1900)
CD4%: 38 % (ref 30.0–60.0)
CD8 T Cell Abs: 690 /uL (ref 230–1000)
CD8tox: 44 % — ABNORMAL HIGH (ref 15.0–40.0)
Ratio: 0.86 — ABNORMAL LOW (ref 1.0–3.0)
Total lymphocyte count: 1560 /uL (ref 1000–4000)

## 2011-12-25 MED ORDER — HYDROXYZINE HCL 50 MG PO TABS
100.0000 mg | ORAL_TABLET | Freq: Every evening | ORAL | Status: DC | PRN
  Administered 2011-12-25 – 2011-12-27 (×2): 100 mg via ORAL
  Filled 2011-12-25: qty 28

## 2011-12-25 NOTE — Progress Notes (Signed)
[facility] Group Notes:  (Counselor/Nursing/MHT/Case Management/Adjunct)  12/25/2011 12:15 PM  Type of Therapy: Group Therapy   Participation Level: Pt did not attend    Christen Butter 12/25/2011, 12:15 PM

## 2011-12-25 NOTE — Discharge Planning (Signed)
Case Manager has called [facility] 2 times today, and they do not have a decision yet on accepting her to the program.  They anticipate having a decision tomorrow.  They are looking carefully to see if they can meet the patient's medical needs.  Case Manager made a preemptive move of calling for a [facility] bed; awaiting call back.  Ambrose Mantle, LCSW 12/25/2011, 3:45 PM

## 2011-12-25 NOTE — Progress Notes (Signed)
Patient ID: Lacey Moses, female   DOB: Mar 06, 1966, 46 y.o.   MRN: [id number] D Pt reports poor sleep last night.  Her medication change to vistaril was not effective. She reports her appetite is good and her energy level is "hyper"  She describes her mood as agitated but pt seemed calm and able to articulate well this am  Pt has been attending groups.  She is still very concerned about her bowels and says she wants" to be sure everything is cleaned out" A- supported pt .  R Pt has been following program and continues to maintain a very neat room with items stacked and labeled.

## 2011-12-25 NOTE — Progress Notes (Signed)
Pt. Was very upset when nurse wanted to put some cream in a cup stating ,"I need the ful container to stay in my room. " Pt. Walked away from med window angry and very loud.

## 2011-12-25 NOTE — Progress Notes (Signed)
Pt. Very cooperative and polite and contracts for safety. Good eye contact. Inquired about all; of her meds this pm. Wanted to show the nurse her family photos in her room and insisted the door be shut partway. Pt. Has all of her toiltrees lined up and stated,"Oh I have OCD bad," Commented her mom has alzheheimers and she is very worried about her. States dad is in his 63's and of good health. No SI or HI. Appears to get along with the other pts.on the hall.

## 2011-12-25 NOTE — Progress Notes (Signed)
[facility] MD Progress Note  12/25/2011 1:28 PM  Diagnosis:  Axis I: Major Depressive Disorder - Recurrent - Severe.  Alcohol Dependence.  Cocaine Abuse.  Cannabis Abuse.   The patient was seen today and reports the following:   ADL's: Intact.  Sleep: The patient reports to having some ongoing difficulty initiating and maintaining sleep.  Appetite: The patient reports a good appetite today.   Mild>(1-10) >Severe  Hopelessness (1-10): 8  Depression (1-10): 8  Anxiety (1-10): 8  Suicidal Ideation: The patient adamantly denies any suicidal ideations today.  Plan: No  Intent: No  Means: No   Homicidal Ideation: The patient adamantly denies any homicidal ideations today.  Plan: No  Intent: No.  Means: No   General Appearance/Behavior: The patient remains cooperative today with this provider.  Eye Contact: Good.  Speech: Appropriate in rate and volume with no pressuring noted today.  Motor Behavior: wnl.  Level of Consciousness: Alert and Oriented x 3.  Mental Status: Alert and Oriented x 3.  Mood: Appears moderately depressed today.  Affect: Appears moderately constricted.  Anxiety Level: Appears moderately anxious today.  Thought Process: wnl.  Thought Content: The patient denies any auditory or visual hallucinations today as well as any delusional thinking.  Perception:. wnl.  Judgment: Fair.  Insight: Fair.  Cognition: Oriented to person, place and time.  Sleep:  Number of Hours: 3.25    Vital Signs:Blood pressure 122/84, pulse 81, temperature 97.5 F (36.4 C), temperature source Oral, resp. rate 16, height 5\' 4"  (1.626 m), weight 78.019 kg (172 lb).  Current Medications: Current Facility-Administered Medications  Medication Dose Route Frequency Provider Last Rate Last Dose  . acetaminophen (TYLENOL) tablet 650 mg  650 mg Oral Q6H PRN Nehemiah Settle, MD   650 mg at 12/19/11 2145  . alum & mag hydroxide-simeth (MAALOX/MYLANTA) 200-200-20 MG/5ML suspension 30 mL   30 mL Oral Q4H PRN Nehemiah Settle, MD      . bisacodyl (DULCOLAX) suppository 10 mg  10 mg Rectal QHS Curlene Labrum Anitra Doxtater, MD   10 mg at 12/16/11 2152  . camphor-menthol (SARNA) lotion   Topical PRN Wonda Cerise, MD      . divalproex (DEPAKOTE ER) 24 hr tablet 1,000 mg  1,000 mg Oral Q2200 Curlene Labrum Tyrell Seifer, MD   1,000 mg at 12/24/11 2247  . efavirenz-emtrictabine-tenofovir (ATRIPLA) 600-200-300 MG per tablet 1 tablet  1 tablet Oral Daily Curlene Labrum Shina Wass, MD   1 tablet at 12/25/11 0830  . fluocinonide (LIDEX) 0.05 % external solution 1 application  1 application Topical BID Ronny Bacon, MD   1 application at 12/24/11 0817  . gabapentin (NEURONTIN) capsule 400 mg  400 mg Oral [id number] Curlene Labrum Koby Pickup, MD   400 mg at 12/25/11 0818  . Gerhardt's butt cream   Topical QID Verne Spurr, PA-C      . hydrochlorothiazide (HYDRODIURIL) tablet 25 mg  25 mg Oral Daily Curlene Labrum Marquisa Salih, MD   25 mg at 12/25/11 0819  . hydrOXYzine (ATARAX/VISTARIL) tablet 50 mg  50 mg Oral QHS PRN Ronny Bacon, MD   50 mg at 12/24/11 2247  . ibuprofen (ADVIL,MOTRIN) tablet 600 mg  600 mg Oral Q8H PRN Curlene Labrum Ermal Haberer, MD   600 mg at 12/24/11 2248  . magnesium hydroxide (MILK OF MAGNESIA) suspension 30 mL  30 mL Oral Daily PRN Nehemiah Settle, MD   30 mL at 12/16/11 0527  . mulitivitamin with minerals tablet 1 tablet  1 tablet Oral  Daily Ronny Bacon, MD   1 tablet at 12/25/11 0818  . ondansetron (ZOFRAN-ODT) disintegrating tablet 4 mg  4 mg Oral Q8H PRN Nehemiah Settle, MD   4 mg at 12/19/11 0854  . polycarbophil (FIBERCON) tablet 625 mg  625 mg Oral Daily Verne Spurr, PA-C   625 mg at 12/25/11 0817  . sertraline (ZOLOFT) tablet 150 mg  150 mg Oral Daily Curlene Labrum Talli Kimmer, MD   150 mg at 12/25/11 0818  . simvastatin (ZOCOR) tablet 10 mg  10 mg Oral q1800 Verne Spurr, PA-C   10 mg at 12/24/11 1724  . sodium chloride (OCEAN) 0.65 % nasal spray 1 spray  1 spray Each Nare PRN Ronny Bacon, MD   1 spray at 12/16/11 0754  . tretinoin (RETIN-A) 0.025 % cream 1 application  1 application Topical QHS Verne Spurr, PA-C   1 application at 12/20/11 2242  . valACYclovir (VALTREX) tablet 1,000 mg  1,000 mg Oral Daily Verne Spurr, PA-C   1,000 mg at 12/25/11 0818  . DISCONTD: efavirenz-emtrictabine-tenofovir (ATRIPLA) 600-200-300 MG per tablet 1 tablet  1 tablet Oral Q breakfast Curlene Labrum Avaleigh Decuir, MD   1 tablet at 12/24/11 1450  . DISCONTD: OLANZapine zydis (ZYPREXA) disintegrating tablet 5 mg  5 mg Oral Q6H PRN Alyson Kuroski-Mazzei, DO   5 mg at 12/25/11 0028  . DISCONTD: polyethylene glycol (MIRALAX / GLYCOLAX) packet 17 g  17 g Oral BID Laveda Norman, MD   17 g at 12/18/11 1722  . DISCONTD: simethicone (MYLICON) chewable tablet 80 mg  80 mg Oral QID Laveda Norman, MD   80 mg at 12/25/11 1203  . DISCONTD: thiamine (VITAMIN B-1) tablet 100 mg  100 mg Oral Daily Alyson Kuroski-Mazzei, DO   100 mg at 12/25/11 0818   Lab Results:  Results for orders placed during the hospital encounter of 12/02/11 (from the past 48 hour(s))  COMPREHENSIVE METABOLIC PANEL     Status: Abnormal   Collection Time   12/23/11  7:31 PM      Component Value Range Comment   Sodium 137  135 - 145 mEq/L    Potassium 3.9  3.5 - 5.1 mEq/L    Chloride 101  96 - 112 mEq/L    CO2 26  19 - 32 mEq/L    Glucose, Bld 133 (*) 70 - 99 mg/dL    BUN 10  6 - 23 mg/dL    Creatinine, Ser 6.96  0.50 - 1.10 mg/dL    Calcium 9.3  8.4 - 29.5 mg/dL    Total Protein 7.1  6.0 - 8.3 g/dL    Albumin 3.5  3.5 - 5.2 g/dL    AST 50 (*) 0 - 37 U/L    ALT 51 (*) 0 - 35 U/L    Alkaline Phosphatase 87  39 - 117 U/L    Total Bilirubin 0.2 (*) 0.3 - 1.2 mg/dL    GFR calc non Af Amer >90  >90 mL/min    GFR calc Af Amer >90  >90 mL/min   CBC     Status: Abnormal   Collection Time   12/23/11  7:31 PM      Component Value Range Comment   WBC 3.4 (*) 4.0 - 10.5 K/uL    RBC 4.73  3.87 - 5.11 MIL/uL    Hemoglobin 14.3  12.0 - 15.0 g/dL      HCT 28.4  13.2 - 44.0 %    MCV 91.8  78.0 - 100.0 fL    MCH 30.2  26.0 - 34.0 pg    MCHC 32.9  30.0 - 36.0 g/dL    RDW 47.8  29.5 - 62.1 %    Platelets 308  150 - 400 K/uL   DIFFERENTIAL     Status: Abnormal   Collection Time   12/23/11  7:31 PM      Component Value Range Comment   Neutrophils Relative 38 (*) 43 - 77 %    Neutro Abs 1.3 (*) 1.7 - 7.7 K/uL    Lymphocytes Relative 47 (*) 12 - 46 %    Lymphs Abs 1.6  0.7 - 4.0 K/uL    Monocytes Relative 11  3 - 12 %    Monocytes Absolute 0.4  0.1 - 1.0 K/uL    Eosinophils Relative 4  0 - 5 %    Eosinophils Absolute 0.1  0.0 - 0.7 K/uL    Basophils Relative 1  0 - 1 %    Basophils Absolute 0.0  0.0 - 0.1 K/uL   VALPROIC ACID LEVEL     Status: Abnormal   Collection Time   12/23/11  7:31 PM      Component Value Range Comment   Valproic Acid Lvl 27.0 (*) 50.0 - 100.0 ug/mL   CD4/CD8 (T-HELPER/T-SUPPRESSOR CELL)     Status: Abnormal   Collection Time   12/24/11  8:00 PM      Component Value Range Comment   Total lymphocyte count 1560  1000 - 4000 /uL    CD4% 38  30.0 - 60.0 %    CD4 absolute 590  500 - 1900 /uL    CD8tox 44 (*) 15.0 - 40.0 %    CD8 T Cell Abs 690  230 - 1000 /uL    Ratio 0.86 (*) 1.0 - 3.0    Review of Systems:  Neurological: The patient denies any headaches today. She denies any seizures or dizziness.  G.I.: The patient once again reports constipation today but with no G.I. Upset today.  Musculoskeletal: The patient denies any muscle or skeletal difficulties.   Time was spent today discussing with the patient her current symptoms. The patient reports to having ongoing difficulty initiating and maintaining sleep and does not feel the Vistaril helped at it's current dosage. She reports a good appetite and continues to display moderate feelings of sadness, anhedonia and depressed mood.  She also is exhibiting ongoing mild to moderate anxiety. She adamantly denies any suicidal or homicidal ideations today as well as any  auditory or visual hallucinations or delusional thinking today.   She denies any medication related side effects.  The patient states that she and her Father are continuing to work on a plan to have her legal charges addressed where she can enter a treatment facility. She states they are both awaiting word from the long term facility where they can start the process of transfer.  The patent also states that she feels the soap suds enema helped yesterday but she still does not feel totally clear.  She asked for this to be repeated today.  Treatment Plan Summary:  1. Daily contact with patient to assess and evaluate symptoms and progress in treatment.  2. Medication management  3. The patient will deny suicidal ideations or homicidal ideations for 48 hours prior to discharge and have a depression and anxiety rating of 3 or less. The patient will also deny any auditory or visual hallucinations or delusional thinking.  4.  The patient will deny any symptoms of substance withdrawal at time of discharge.   Plan:  1. Will continue the medication Zoloft at 150 mgs po q am for depression and anxiety.  2. Will continue the medication Neurontin at 400 mgs po q am, 2 pm and hs for anxiety and mood stabilization.  3. Will continue the patient on her non-psychiatric medications.  4. Will continue the medication Depakote ER at 1000 mgs po qhs for mood stabilization.  5. Will continue the medication Neurontin 400 mgs po q am, 2 pm and hs for mood stabilization.  6. Will increase the medication  Vistaril to 100 mgs po qhs - prn for sleep.   7. Laboratory studies reviewed.  8. Will order another soap suds enema for the patient's constipation.  9. Will continue to monitor.   Macaiah Mangal 12/25/2011, 1:28 PM

## 2011-12-26 ENCOUNTER — Inpatient Hospital Stay (HOSPITAL_COMMUNITY)
Admission: AD | Admit: 2011-12-26 | Discharge: 2011-12-26 | Disposition: A | Discharge status: Home / Self Care | Payer: PRIVATE HEALTH INSURANCE | Source: Ambulatory Visit | Attending: Psychiatry | Admitting: Psychiatry

## 2011-12-26 DIAGNOSIS — F429 Obsessive-compulsive disorder, unspecified: Secondary | ICD-10-CM | POA: Diagnosis present

## 2011-12-26 LAB — HIV-1 RNA ULTRAQUANT REFLEX TO GENTYP+
HIV 1 RNA Quant: <20 copies/mL (ref <20)
HIV-1 RNA Quant, Log: <1.3 {Log} (ref <1.30)

## 2011-12-26 MED ORDER — QUETIAPINE FUMARATE 400 MG PO TABS
400.0000 mg | ORAL_TABLET | Freq: Every day | ORAL | Status: DC
  Administered 2011-12-26: 400 mg via ORAL
  Filled 2011-12-26: qty 1
  Filled 2011-12-26: qty 14
  Filled 2011-12-26 (×2): qty 1

## 2011-12-26 MED ORDER — SERTRALINE HCL 100 MG PO TABS
200.0000 mg | ORAL_TABLET | Freq: Every day | ORAL | Status: DC
  Administered 2011-12-27: 200 mg via ORAL
  Filled 2011-12-26 (×3): qty 2
  Filled 2011-12-26: qty 28

## 2011-12-26 NOTE — Progress Notes (Signed)
[facility] Group Notes:  (Counselor/Nursing/MHT/Case Management/Adjunct)  12/26/2011 12:30 PM  Type of Therapy: Group Therapy   Participation Level: Did not attend   Lacey Moses 12/26/2011  12:30 pm

## 2011-12-26 NOTE — Discharge Planning (Signed)
Case Manager informed patient that word was received from [facility] that they will not accept the patient to their program due to (1) Schizoaffective disorder diagnosis, (2) voices in the past, and (3) suicidality.  Patient was initially upset and asked about her options.  CM told her she is on [facility] wait list which is probably 3-4 weeks away for a bed, and on the [facility] wait list which is probably 8-10 days away for a bed.  She called [facility], and wants CM to follow up.  She has an interview set for [facility] on 6/21 at 11:00AM.  She signed consent for CM to talk with them.  Case Manager did utilization review and authorization was given through tonight, with no more days to be auth'd afterward.  CM shared with patient that we really need to decide on a plan to proceed by Friday with discharge, need to find out if she can go to her father's.  She agreed CM can call father, has signed consent.    In a few minutes she was in the hall very agitated and demanded to talk to CM again, said she feels that CM is trying to kick her out of the hospital before her mental health and medical needs are met.  CM tried to explain that her medical needs are not reason for staying at a behavioral health hospital.  She was accusatory and unpleasant, and would not allow CM to actually speak, so no explanation could be given.  She put her pastor/mentor on the phone and even though that individual was backing up what CM was attempting to say, patient continued to escalate to the point that CM left the room after telling her that it was not beneficial for [place] to try to talk when she will not let CM actually finish a sentence.  She immediately accused CM of having a bad attitude.  Later in the hall she was laughing with another patient.  Ambrose Mantle, LCSW 12/26/2011, 6:26 PM

## 2011-12-26 NOTE — Progress Notes (Signed)
Pt. Just woke up from a nap and stated she had a good day. Pt. Has good eye contact but can be very loud and gets agitated easily. Contracts for safety and denies SI or HI. Pt. States she has OCD evident by the way she has all of her personal items lined up on her desk.

## 2011-12-26 NOTE — Progress Notes (Signed)
[facility] MD Progress Note  12/26/2011 3:42 PM  Diagnosis:  Axis I: Major Depressive Disorder - Recurrent - Severe.  Obsessive Compulsive Disorder.  Alcohol Dependence.  Cocaine Abuse.  Cannabis Abuse.   The patient was seen today and reports the following:   ADL's: Intact.  Sleep: The patient reports to again having difficulty initiating and maintaining sleep.  Appetite: The patient reports a good appetite today.   Mild>(1-10) >Severe  Hopelessness (1-10): 10  Depression (1-10): 10  Anxiety (1-10): 10  Suicidal Ideation: The patient adamantly denies any suicidal ideations today.  Plan: No  Intent: No  Means: No   Homicidal Ideation: The patient adamantly denies any homicidal ideations today.  Plan: No  Intent: No.  Means: No   General Appearance/Behavior: The patient remains cooperative today with this provider.  Eye Contact: Good.  Speech: Appropriate in rate and volume with no pressuring noted today.  Motor Behavior: wnl.  Level of Consciousness: Alert and Oriented x 3.  Mental Status: Alert and Oriented x 3.  Mood: Appears moderately depressed today.  Affect: Appears moderately constricted.  Anxiety Level: Appears moderately anxious today.  Thought Process: wnl.  Thought Content: The patient denies any auditory or visual hallucinations today as well as any delusional thinking.  Perception:. wnl.  Judgment: Fair.  Insight: Fair.  Cognition: Oriented to person, place and time.  Sleep:  Number of Hours: 4    Vital Signs:Blood pressure 119/91, pulse 90, temperature 98.1 F (36.7 C), temperature source Oral, resp. rate 16, height 5\' 4"  (1.626 m), weight 78.019 kg (172 lb).  Current Medications: Current Facility-Administered Medications  Medication Dose Route Frequency Provider Last Rate Last Dose  . acetaminophen (TYLENOL) tablet 650 mg  650 mg Oral Q6H PRN Nehemiah Settle, MD   650 mg at 12/19/11 2145  . alum & mag hydroxide-simeth (MAALOX/MYLANTA)  200-200-20 MG/5ML suspension 30 mL  30 mL Oral Q4H PRN Nehemiah Settle, MD      . bisacodyl (DULCOLAX) suppository 10 mg  10 mg Rectal QHS Curlene Labrum Akaiya Touchette, MD   10 mg at 12/25/11 2200  . camphor-menthol (SARNA) lotion   Topical PRN Wonda Cerise, MD      . divalproex (DEPAKOTE ER) 24 hr tablet 1,000 mg  1,000 mg Oral Q2200 Curlene Labrum Marcoantonio Legault, MD   1,000 mg at 12/25/11 2117  . efavirenz-emtrictabine-tenofovir (ATRIPLA) 600-200-300 MG per tablet 1 tablet  1 tablet Oral Daily Ronny Bacon, MD   1 tablet at 12/26/11 0802  . fluocinonide (LIDEX) 0.05 % external solution 1 application  1 application Topical BID Ronny Bacon, MD   1 application at 12/24/11 0817  . gabapentin (NEURONTIN) capsule 400 mg  400 mg Oral [id number] Curlene Labrum Kayona Foor, MD   400 mg at 12/26/11 1428  . Gerhardt's butt cream   Topical QID Verne Spurr, PA-C      . hydrochlorothiazide (HYDRODIURIL) tablet 25 mg  25 mg Oral Daily Curlene Labrum Rielyn Krupinski, MD   25 mg at 12/26/11 0803  . hydrOXYzine (ATARAX/VISTARIL) tablet 100 mg  100 mg Oral QHS PRN Curlene Labrum Twinkle Sockwell, MD   100 mg at 12/25/11 2315  . ibuprofen (ADVIL,MOTRIN) tablet 600 mg  600 mg Oral Q8H PRN Curlene Labrum Lezly Rumpf, MD   600 mg at 12/24/11 2248  . magnesium hydroxide (MILK OF MAGNESIA) suspension 30 mL  30 mL Oral Daily PRN Nehemiah Settle, MD   30 mL at 12/16/11 0527  . mulitivitamin with minerals tablet 1 tablet  1 tablet Oral Daily Ronny Bacon, MD   1 tablet at 12/26/11 0802  . ondansetron (ZOFRAN-ODT) disintegrating tablet 4 mg  4 mg Oral Q8H PRN Nehemiah Settle, MD   4 mg at 12/26/11 0051  . polycarbophil (FIBERCON) tablet 625 mg  625 mg Oral Daily Verne Spurr, PA-C   625 mg at 12/26/11 0803  . sertraline (ZOLOFT) tablet 150 mg  150 mg Oral Daily Curlene Labrum Mairim Bade, MD   150 mg at 12/26/11 0802  . simvastatin (ZOCOR) tablet 10 mg  10 mg Oral q1800 Verne Spurr, PA-C   10 mg at 12/25/11 1812  . sodium chloride (OCEAN) 0.65 % nasal spray 1  spray  1 spray Each Nare PRN Ronny Bacon, MD   1 spray at 12/16/11 0754  . tretinoin (RETIN-A) 0.025 % cream 1 application  1 application Topical QHS Verne Spurr, PA-C   1 application at 12/25/11 2118  . valACYclovir (VALTREX) tablet 1,000 mg  1,000 mg Oral Daily Verne Spurr, PA-C   1,000 mg at 12/26/11 0803   Lab Results:  Results for orders placed during the hospital encounter of 12/02/11 (from the past 48 hour(s))  CD4/CD8 (T-HELPER/T-SUPPRESSOR CELL)     Status: Abnormal   Collection Time   12/24/11  8:00 PM      Component Value Range Comment   Total lymphocyte count 1560  1000 - 4000 /uL    CD4% 38  30.0 - 60.0 %    CD4 absolute 590  500 - 1900 /uL    CD8tox 44 (*) 15.0 - 40.0 %    CD8 T Cell Abs 690  230 - 1000 /uL    Ratio 0.86 (*) 1.0 - 3.0    Review of Systems:  Neurological: The patient denies any headaches today. She denies any seizures or dizziness.  G.I.: The patient once again reports constipation today but with no G.I. Upset today.  Musculoskeletal: The patient denies any muscle or skeletal difficulties.   Time was spent today discussing with the patient her current symptoms. The patient reports to having ongoing difficulty initiating and maintaining sleep.  She reports a good appetite and continues to display moderate feelings of sadness, anhedonia and depressed mood. She also is exhibiting ongoing mild to moderate anxiety.  She adamantly denies any suicidal or homicidal ideations today as well as any auditory or visual hallucinations or delusional thinking today. She denies any medication related side effects.   The patient reports that she learned this morning she was not accepted at the [facility] facility.  The patient states now that she is very depressed but is willing to go to either [facility] of [facility] when a bed is available.   Treatment Plan Summary:  1. Daily contact with patient to assess and evaluate symptoms and progress in treatment.  2. Medication  management  3. The patient will deny suicidal ideations or homicidal ideations for 48 hours prior to discharge and have a depression and anxiety rating of 3 or less. The patient will also deny any auditory or visual hallucinations or delusional thinking.  4. The patient will deny any symptoms of substance withdrawal at time of discharge.   Plan:  1. Will increase the medication Zoloft to 200 mgs po q am for depression and anxiety.  2. Will continue the medication Neurontin at 400 mgs po q am, 2 pm and hs for anxiety and mood stabilization.  3. Will continue the patient on her non-psychiatric medications.  4. Will  continue the medication Depakote ER at 1000 mgs po qhs for mood stabilization.  5. Will continue the medication Neurontin 400 mgs po q am, 2 pm and hs for mood stabilization.  6. Will continue the medication Vistaril to 100 mgs po qhs - prn for sleep.  7. Will start the medication Seroquel at 400 mgs po qhs for sleep and mood stabilization.  8. Laboratory studies reviewed.  9. Will order another abdominal x-ray to evaluate for stool impaction. 10. Will continue to monitor.    Kedra Mcglade 12/26/2011, 3:42 PM

## 2011-12-26 NOTE — Progress Notes (Signed)
Patient ID: Lacey Moses, female   DOB: 11-Mar-1966, 46 y.o.   MRN: [id number]  D: Patient pleasant on approach. Reports Still not sleeping well and some stomach issues. Reports possibly going to get an x-ray but unknown to undersigned about this at present. Reports depression "8" and hopelessness "8". Denies any SI. States she is waiting for placement.  A: Staff will monitor on q 15 minute checks and encourage group attendance. R: Cooperative and took meds without issue.

## 2011-12-26 NOTE — Progress Notes (Signed)
[facility] Group Notes:  (Counselor/Nursing/MHT/Case Management/Adjunct)  12/26/2011 2:30 PM  Type of Therapy:  [employer] Support  Participation Level:  Minimal  Participation Quality:  Attentive and Sharing  Affect:  Blunted  Cognitive:  Alert and Oriented  Insight:  Limited  Engagement in Group:  Limited  Engagement in Therapy:  Limited  Modes of Intervention:  Clarification, Education, Socialization and Support  Summary of Progress/Problems:  Pt participated in this psycho/educational session by listening attentively. Pt was in and out of group 2x due diarreah Therapist introduced the representative from the [employer].  He explained to the group that he was here to tell his story and inform them of the free services available at the [employer].  The speaker told his story and explained the progression of his Schizoaffective Disorder and Substance Abuse and the course of his recovery.  He entertained questions and handed out information cards.  Pt expressed interest in the services.  Therapist encouraged patients to take advantage of these free services that would be an excellent addition to their support system.     Marni Griffon C 12/26/2011, 2:30 PM

## 2011-12-26 NOTE — Progress Notes (Signed)
Pt. Stated I am disappointed that [facility]. Turned me down but,":I turned my frown from up to upside down.and made [phone calls. " States she has.  a phone interview on Friday with [facility] long term [facility]. States she had one BM today that was ,"mussy."

## 2011-12-26 NOTE — Progress Notes (Signed)
Pt. denies lethality and A/V/H's or other problems beyond her constant concerns over what medications she is taking and when.  Pt. also asked for a print-out of her current meds and was told that she could not have that until she was given her discharge medications and instructions.   Pt. Constantly referred to her "OCD, as you can see", as pulled out her neat and tidy drawers to demonstrate her control over her enviroment. Pt. also tends to be argumentative, but is redirectable and is appropriate with staff and peers: Pt. always asks for her bed room to be locked when she is out of it, since she is convinced that some peers will go into her room and take her things.  Pt. attended group this evening, but walked out after an irritable peer began to complain loudly of some perceived issues she was feeling from her doctor and others. Pt. seen in the DR laughing with a few peers.

## 2011-12-26 NOTE — Progress Notes (Addendum)
D: Bhakti in bed at the beginning of this shift. She asked if she is still has an order for Zyprexa because it made her sleep very well last night. Pt received Vistaril 100 mg after the change of shift.  A: Writer encouraged patient to wait for an hour for the Vistaril to kick in and it it does not work she should come to the window for the Zyprexa. Her mood and affect was appropriate during this assessment. R: She said she had a "soso day". She denied SI/HI. Q 15 minute check continues to maintain safety.

## 2011-12-27 MED ORDER — HYDROCHLOROTHIAZIDE 25 MG PO TABS: 25.0000 mg | ORAL_TABLET | Freq: Every day | ORAL | Status: AC

## 2011-12-27 MED ORDER — SALINE SPRAY 0.65 % NA SOLN: 1.0000 | NASAL | Status: AC | PRN

## 2011-12-27 MED ORDER — EFAVIRENZ-EMTRICITAB-TENOFOVIR 600-200-300 MG PO TABS: 1.0000 | ORAL_TABLET | Freq: Every day | ORAL | Status: AC

## 2011-12-27 MED ORDER — SIMVASTATIN 10 MG PO TABS: 10.0000 mg | ORAL_TABLET | Freq: Every day | ORAL | Status: AC

## 2011-12-27 MED ORDER — GERHARDT'S BUTT CREAM: 1.0000 "application " | TOPICAL_CREAM | Freq: Four times a day (QID) | CUTANEOUS | Status: AC

## 2011-12-27 MED ORDER — ADULT MULTIVITAMIN W/MINERALS CH: 1.0000 | ORAL_TABLET | Freq: Every day | ORAL | Status: AC

## 2011-12-27 MED ORDER — VALACYCLOVIR HCL 1 G PO TABS: 1000.0000 mg | ORAL_TABLET | Freq: Every day | ORAL | Status: AC

## 2011-12-27 MED ORDER — SERTRALINE HCL 100 MG PO TABS: 200.0000 mg | ORAL_TABLET | Freq: Every day | ORAL | Status: AC

## 2011-12-27 MED ORDER — FLUOCINONIDE 0.05 % EX SOLN: 1.0000 "application " | Freq: Two times a day (BID) | CUTANEOUS | Status: AC

## 2011-12-27 MED ORDER — HYDROXYZINE HCL 50 MG PO TABS: 100.0000 mg | ORAL_TABLET | Freq: Every evening | ORAL | Status: AC | PRN

## 2011-12-27 MED ORDER — GABAPENTIN 400 MG PO CAPS: 400.0000 mg | ORAL_CAPSULE | ORAL | Status: AC

## 2011-12-27 MED ORDER — TRETINOIN 0.025 % EX CREA: 1.0000 "application " | TOPICAL_CREAM | Freq: Every day | CUTANEOUS | Status: AC

## 2011-12-27 MED ORDER — DIVALPROEX SODIUM ER 500 MG PO TB24: 1000.0000 mg | ORAL_TABLET | Freq: Every day | ORAL | Status: AC

## 2011-12-27 MED ORDER — CAMPHOR-MENTHOL 0.5-0.5 % EX LOTN: TOPICAL_LOTION | CUTANEOUS | Status: AC | PRN

## 2011-12-27 MED ORDER — QUETIAPINE FUMARATE 400 MG PO TABS: 400.0000 mg | ORAL_TABLET | Freq: Every day | ORAL | Status: AC

## 2011-12-27 MED ORDER — CALCIUM POLYCARBOPHIL 625 MG PO TABS: 625.0000 mg | ORAL_TABLET | Freq: Every day | ORAL | Status: AC

## 2011-12-27 NOTE — Progress Notes (Signed)
[facility] Group Notes:  (Counselor/Nursing/MHT/Case Management/Adjunct)  12/27/2011 10:30 AM  Type of Therapy: Group Therapy   Participation Level: Did not attend     Adaysha Dubinsky 12/27/2011  10:30 AM      

## 2011-12-27 NOTE — Discharge Planning (Signed)
A bed at [facility] was obtained this morning, at least 3 weeks earlier than previously believed it would be.  Treatment Team discussed and decided it would be prudent to place patient under Involuntary Commitment, as she had repeatedly declined going to any 28-day program, even with the explanation that she could begin her needed substance abuse treatment while taking all the necessary steps to apply to a 2-year program.  She remained adamant throughout her stay at [facility] that she would only accept a 2-year program, which was not the most viable option to pursue given the length of time such a step takes, as well as the past history of rejections from such long-term programs of mental health patients on medications and with behaviors similar to this patient's.  Paperwork was initiated to place patient under IVC.  During the same period of time today, the patient's advocate, Rev. Riley Lam, came for a meeting with the Treatment Team after the normal hours for that team to meet.  There was some discussion of trying to meet later in the day, but this upset both the advocate and the patient so a meeting was pulled together quickly with Dr. Allena Katz, Case Manager, Counselor, patient's Nurse, Assistant Director, along with the patient and her advocate.  The reaction to the news of both the transfer to [facility] and of being placed under IVC were not well received by either the patient or her advocate.  It was difficult to try to discuss this logically, as there were numerous interruptions, emotional outbursts and accusations from the patient toward the treatment team.  The patient had been adamant that she could not leave this hospital without completing an interview with [facility], yet that interview was actually scheduled to happen when the patient was supposed to be in the admission process at [facility].  Therefore, Case Manager called to reschedule that phone interview for the following day.  This enraged the patient who again was  accusational of improper actions by CM.  The patient told the treatment team that CM had been rude and threatening with regards to the fact that patient had reached the end of her insurance approvals, and would be billed for any additional days.  Case Manager along with the Director of Social Work Department for [employer] [facility] yesterday informed the patient (as required by [employer]) that any future days would be billed to her.  She was very angry about this and felt that it was implied she could not afford to stay at [facility].  Case Manager tried to explain this and ease patient's anger multiple times yesterday and today, but this was not effective due to her continued outbursts.  The patient continued throughout Treatment Team meeting to adamantly refuse to go to [facility], stating she wanted to go live with her father and take care of her outstanding arrest warrant.  She also asked to stay for several more days in the hospital to "get the medications right, I just need a couple more days."  She referred many times to being "kicked out" of the hospital.  It was decided between doctor, Chiropodist, and patient that she would discharge rather than comply with the plan that had been put in place.  She wanted to call [facility] and reset that appointment again for Friday, then get her legal issues resolved and work on going to the long-term program.  It was explained that there was no assurance that she would be accepted into that program, and she would not  let anyone saying that finish what they were saying.  She said [facility] staff told her "they don't care about my mental health, they don't care about my medicine, they have space, and they'll take me."  She was wished the best of luck sincerely by team members, and went to pack.  Case Manager did call [facility] in [facility] and asked the referral coordinator to call patient at her given telephone number to set up a follow-up appointment.  Also  provided her with the name and information for [facility] to call if she desires to find out where she is on the wait list.    Ambrose Mantle, LCSW 12/27/2011, 3:54 PM

## 2011-12-27 NOTE — Progress Notes (Signed)
Patient ID: Lacey Moses, female   DOB: May 28, 1966, 46 y.o.   MRN: [id number]   D: Patient anxious some on approach. Still awaiting placement at this time. Reports depression and hopelessness feelings at an "8". Reports some agitation today but doesn't state what about. Joking with undersigned.  A: Staff will monitor and casemanagement is still working on placement. R: Patient took meds without difficulty. Refusing to attend discharge planning meeting.

## 2011-12-27 NOTE — Progress Notes (Signed)
Patient ID: Lacey Moses, female   DOB: 20-Nov-1965, 46 y.o.   MRN: [id number]   D: Patient at baseline of functioning. Has been denying SI this week. Did not want to go to the long term treatment that treatment team had set up for her so she called her father to see if she could stay there and he said yes. Case manager set up follow-up appointment. Started going over discharge information and she started saying "I know" and just wanted to sign. She did sign consent form for follow-up after encouragement.  A: Patient got all items from locker, room, meds, samples R: Signed all paperwork and left with friend

## 2011-12-27 NOTE — Progress Notes (Signed)
Discharge Planning Meeting  12:15 PM  Attending:  Franchot Gallo, MD, Everlene Balls, Asst. Director, RN,  Ambrose Mantle, Case Manager, Manuela Schwartz, RN, Marni Griffon, Counselor, Rev. Nikki Dom, Support Person for Pt., and Pt  Pt voiced complaints that we were not handling things the way she wanted.  She angrily stated that she was mad that the telephone interview had been changed and made accusatory remarks about being treated unfairly.  She explained that it was very important for her to become stabilized on medication and she very much wanted to enter a long term program such as [facility] which is a 2 yr. Program.  She stated she did not want to go to [facility]. She wanted to go to her Dad's and after pending legal case is resolved, enter [facility].  Counselor explained that we agreed that it was imperative for her to stabilize on medication and that we realized this was the first step to supporting her recovery efforts.  It was further explained that going to [facility] would give her time to stabilize on medication, receive substance abuse treatment and enhance her chances of being accepted at [facility].  Pt continued to talk over case manager and others and insist that she was not going to [facility].   She reported that she had spoken with her father who had told her he was going to talk with the DA and an attorney with regard to her pending case and that he knew the [place].  He was asking for a dismissal.  Pt was informed that we had received a call from [facility] shortly before this meeting advising [place] they had a bed open for tomorrow and to send patient.  IVC proceedings were initiated as normal protocol for transfer to a State facility to insure safe transportation for the patient. In light of this, we rescheduled the telephone interview for Pt with [place] at [facility] from tomorrow at 11:00am to Saturday at 2:30 pm.  MD advised Pt that if she did not take the bed at [facility] they would give this to someone else.  Pt  stated she wanted to go to her Dad's, clear up the legal matter, have the telephone conference with [facility], and enter [facility].  Pt and her support went to the phone room and called her father.  They returned to report that he was willing for Pt to stay with him. MD agreed Pt could discharge now with her support person if that was her desire, again encouraging her to go to [facility].   Everlene Balls confirmed with Pt that she understood fully that she would not be able to enter [facility] if she gave up this opportunity.  Pt stated she was going to pack.  Support person stated she felt the upsetting thing was the IVC and she was goi to try to convince Pt to go to [facility].  She went to Patient's room.  In the meantime, Sheriffs came to serve the IVC papers, which were left with the nurse.  The papers are being held in the chart pending Pt's decision.

## 2011-12-27 NOTE — Tx Team (Signed)
Interdisciplinary Treatment Plan Update (Adult)  Date:  12/27/2011  Time Reviewed:   12:30pm  Progress in Treatment: Attending groups:  No, refuses Participating in groups:    No, she only has attended a few groups, and states that she does not want to go to groups which "trigger" her, does not feel comfortable sharing, or has diarrhea/stomach issues, cannot go Taking medication as prescribed:    Yes Tolerating medication:   Yes Family/Significant other contact made:  Yes, with father and with advocate Patient understands diagnosis:   Yes, poor insight, poor judgment Discussing patient identified problems/goals with staff:   Yes Medical problems stabilized or resolved:   Yes, although patient remains concerned about having diarrhea Denies suicidal/homicidal ideation:  Yes, adamantly Issues/concerns per patient self-inventory:   None Other:    New problem(s) identified: Yes, Describe:  Patient was accepted to go to [facility], and refused the bed  Reason for Continuation of Hospitalization: None  Interventions implemented related to continuation of hospitalization:  Medication monitoring and adjustment, safety checks Q15 min., suicide risk assessment, group therapy, psychoeducation, collateral contact, aftercare planning, ongoing physician assessments, medication education - UNTIL DISCHARGE  Additional comments:  Not applicable  Estimated length of stay:  Discharge today  Discharge Plan:  Go to live with father, take care of legal charges, follow up at [facility] and/or [facility]  New goal(s):  Not applicable  Review of initial/current patient goals per problem list:   1.  Goal(s):  Deny SI for 48 hours prior to D/C.  Met:  Yes  Target date:  By Discharge   As evidenced by:  Has been denying for some time  2.  Goal(s):  Decide if & how to address substance abuse issues at discharge.  Met:  Yes  Target date:  By Discharge   As evidenced by:  Will only accept going to a 2-year program,  angrily refused the bed offered at a 28-day program  3.  Goal(s):  Reduce auditory and visual hallucinations to baseline per patient and collateral report  Met:  Yes  Target date:  By Discharge   As evidenced by:  Patient has been denying AVH for some time  4.  Goal(s):  Reduce depression to no greater than 3 at discharge.  Met:  No  Target date:  By Discharge   As evidenced by:  Patient's depression today was 7-8, but she was adamant she would discharge rather than continue treatment for her depression, medication stabilization and substance abuse treatment at another facility  Attendees: Patient:  Lacey Moses  12/27/2011  12:30pm  Family:  Dr. Irene Pap. Nikki Dom 12/27/2011 12:30pm  Physician:  Dr. Harvie Heck Readling 12/27/2011  12:30pm  Nursing:   Manuela Schwartz, RN 12/27/2011  12:30pm  Case Manager:  Ambrose Mantle, LCSW 12/27/2011 12:30pm  Counselor:  Marni Griffon, LCAS 12/27/2011 12:30pm  Other:   Everlene Balls, RN 12/27/2011 12:30pm  Other:      Other:      Other:       Scribe for Treatment Team:   Sarina Ser, 12/27/2011,  12:30pm

## 2011-12-27 NOTE — Progress Notes (Signed)
D: Patient in her room arranging some stuff during this assessment. She reported  having a good day, although she said that the treatment facility she surppose to be going to rejected her due to her mental illness and the number of psych med she is taking. She also stated that she called [facility] and made appointment with them for Friday at 11:00am. Patient is hopeful and excited about that. She said her only concern is finding a place to ogo. She denied SI/HI and denied hallucinations. She complaint of insomnia and said her Vistaril was discontinued. A: Writer encouraged patient to be hopeful, checked if patient has any medication for sleep. Vistaril still active. Writer gave 100 mg Vistaril for sleep. Encouraged and supported patient. R: Patient received and tolerated medication. Appreciative of care and returned to her room.

## 2011-12-27 NOTE — Progress Notes (Signed)
[facility] Group Notes:  (Counselor/Nursing/MHT/Case Management/Adjunct)  12/27/2011 12:30 PM  Type of Therapy: Group Therapy   Participation Level: Pt did not attend     Marni Griffon 12/27/2011  12:30 PM

## 2011-12-27 NOTE — [facility] Suicide Risk Assessment (Signed)
Suicide Risk Assessment  Discharge Assessment     Demographic factors:  Low socioeconomic status;Living alone;Unemployed  Current Mental Status Per Nursing Assessment::   On Admission:    At Discharge:  The patient was AO x 3.  She reported moderate feelings of sadness, anhedonia and depressed mood but adamantly denied any suicidal or homicidal ideations.  She also denied any auditory or visual hallucinations or delusional thinking.  The patient denies any symptoms of alcohol withdrawal or physical complaints today.  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  Obsessive Compulsive Disorder.  Alcohol Dependence.  Cocaine Abuse.  Cannabis Abuse.  Axis II:  Cluster B Personality Disorder.  The patient was seen today and reports the following:   ADL's: Intact.  Sleep: The patient reports to sleeping much better last night.  Appetite: The patient reports a good appetite today.   Mild>(1-10) >Severe  Hopelessness (1-10): 7-8  Depression (1-10): 7-8  Anxiety (1-10): 7-8   Suicidal Ideation: The patient adamantly denies any suicidal ideations today.  Plan: No  Intent: No  Means: No   Homicidal Ideation: The patient adamantly denies any homicidal ideations today.  Plan: No  Intent: No.  Means: No   General Appearance/Behavior: The patient was initially cooperative today with this provider but later became angry and uncooperative with the treatment team when it was discussed the possibility of her transferring to [facility] tomorrow. Eye Contact: Good.  Speech: Increased in rate and volume with no pressuring noted today.  Motor Behavior: wnl.  Level of Consciousness: Alert and Oriented x 3.  Mental Status: Alert and Oriented x 3.  Mood: Appears euthymic today.  Affect: Appears moderately angry..  Anxiety Level: No anxiety noted today.  Thought Process: wnl.  Thought Content: The patient denies any auditory or visual  hallucinations today as well as any delusional thinking.  Perception:. wnl.  Judgment: Fair.  Insight: Fair.  Cognition: Oriented to person, place and time.   Loss Factors: Decline in physical health;Financial problems / change in socioeconomic status  Historical Factors: Prior suicide attempts;Impulsivity  Poor Insight into mental health issues.  Risk Reduction Factors:   Good Family Support.  Good [employer]. Good Access to Healthcare.  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    Obsessive Compulsive Disorder.    Alcohol Dependence.    Cocaine Abuse.    Cannabis Abuse. AXIS II:   Cluster B Personality Disorder. AXIS III:  1.  HIV Positive.   2.  Hypertension.   3.  Hyperlipidemia.   4.  Heart Murmur.   5.  Gastroesophageal Reflux Disease.   6.  H/O Pancreatitis.  AXIS IV:   Chronic Mental Illness.  Long History of Substance Abuse Issues.  Serious Chronic Non-psychiatric Medical Issues.  Unemployment.  Little Insight Into Mental Health Issues. AXIS V:   GAF at time of admission approximately 35.  GAF at time of discharge approximately 60.  Cognitive Features That Contribute To Risk:  Closed-mindedness Thought constriction (tunnel vision)    Current Medications:  Current Facility-Administered Medications   Medication  Dose  Route  Frequency  Provider  Last Rate  Last  Dose   .  acetaminophen (TYLENOL) tablet 650 mg  650 mg  Oral  Q6H PRN  Nehemiah Settle, MD   650 mg at 12/19/11 2145   .  alum & mag hydroxide-simeth  (MAALOX/MYLANTA) 200-200-20 MG/5ML suspension 30 mL  30 mL  Oral  Q4H PRN  Nehemiah Settle, MD     .  bisacodyl (DULCOLAX) suppository 10 mg  10 mg  Rectal  QHS  Curlene Labrum Sharece Fleischhacker, MD   10 mg at 12/25/11 2200   .  camphor-menthol (SARNA) lotion   Topical  PRN  Wonda Cerise, MD     .  divalproex (DEPAKOTE ER) 24 hr tablet 1,000 mg  1,000 mg  Oral  Q2200  Curlene Labrum Dalila Arca, MD   1,000 mg at 12/25/11 2117   .  efavirenz-emtrictabine-tenofovir (ATRIPLA) 600-200-300 MG per tablet 1 tablet  1 tablet  Oral  Daily  Ronny Bacon, MD   1 tablet at 12/26/11 0802   .  fluocinonide (LIDEX) 0.05 % external solution 1 application  1 application  Topical  BID  Ronny Bacon, MD   1 application at 12/24/11 0817   .  gabapentin (NEURONTIN) capsule 400 mg  400 mg  Oral  [id number]  Curlene Labrum Torien Ramroop, MD   400 mg at 12/26/11 1428   .  Gerhardt's butt cream   Topical  QID  Verne Spurr, PA-C     .  hydrochlorothiazide (HYDRODIURIL) tablet 25 mg  25 mg  Oral  Daily  Curlene Labrum Marlane Hirschmann, MD   25 mg at 12/26/11 0803   .  hydrOXYzine (ATARAX/VISTARIL) tablet 100 mg  100 mg  Oral  QHS PRN  Curlene Labrum Kieli Golladay, MD   100 mg at 12/25/11 2315   .  ibuprofen (ADVIL,MOTRIN) tablet 600 mg  600 mg  Oral  Q8H PRN  Curlene Labrum Alicha Raspberry, MD   600 mg at 12/24/11 2248   .  magnesium hydroxide (MILK OF MAGNESIA) suspension 30 mL  30 mL  Oral  Daily PRN  Nehemiah Settle, MD   30 mL at 12/16/11 0527   .  mulitivitamin with minerals tablet 1 tablet  1 tablet  Oral  Daily  Ronny Bacon, MD   1 tablet at 12/26/11 0802   .  ondansetron (ZOFRAN-ODT) disintegrating tablet 4 mg  4 mg  Oral  Q8H PRN  Nehemiah Settle, MD   4 mg at 12/26/11 0051   .  polycarbophil (FIBERCON) tablet 625 mg  625 mg  Oral  Daily  Verne Spurr, PA-C   625 mg at 12/26/11 0803   .  sertraline (ZOLOFT) tablet 150 mg  150 mg  Oral  Daily  Curlene Labrum Amiee Wiley, MD   150 mg at 12/26/11 0802   .  simvastatin (ZOCOR) tablet 10 mg  10 mg  Oral  q1800  Verne Spurr, PA-C   10 mg at 12/25/11 1812   .  sodium chloride (OCEAN) 0.65 % nasal spray 1 spray  1 spray  Each Nare  PRN  Ronny Bacon, MD   1 spray at 12/16/11 0754   .  tretinoin (RETIN-A) 0.025 % cream 1 application  1 application  Topical  QHS  Verne Spurr, PA-C   1 application at 12/25/11 2118   .  valACYclovir (VALTREX) tablet 1,000 mg  1,000 mg  Oral  Daily  Verne Spurr, PA-C   1,000 mg at 12/26/11 5284    Lab Results:  Results for orders placed during the hospital encounter of 12/02/11 (from the past 48 hour(s))   CD4/CD8 (T-HELPER/T-SUPPRESSOR CELL) Status: Abnormal    Collection Time    12/24/11 8:00 PM   Component  Value  Range  Comment    Total lymphocyte count  1560  1000 - 4000 /uL     CD4%  38  30.0 - 60.0 %     CD4 absolute  590  500 - 1900 /uL     CD8tox  44 (*)  15.0 - 40.0 %     CD8 T Cell Abs  690  230 - 1000 /uL     Ratio  0.86 (*)  1.0 - 3.0     Review of Systems:  Neurological: The patient denies any headaches today. She denies any seizures or dizziness.  G.I.: The patient denies any constipation today or any G.I. Upset.  Musculoskeletal: The patient denies any muscle or skeletal difficulties.   Time was spent today discussing with the patient her current symptoms. The patient reports to sleeping much better last night since restarting the medication Seroquel. She reports a good appetite and continues to report moderate feelings of sadness, anhedonia and depressed mood.  However the patient did not appear depressed during the interview.  She also is not exhibiting any significant anxiety symptoms but is reporting moderate anxiety.  She adamantly denies any suicidal or homicidal ideations today as well as any auditory or visual hallucinations or delusional thinking today. She denies any medication related side effects.   Later in the afternoon, the treatment team received a call from [facility] that the patient had been accepted into their program with a plan for  transfer tomorrow.  A meeting was arranged with the patient and her advocate to discuss this plan.  The patient was told of the placement and the need to assure her safety to [facility] by having her sent by IVC.  The patient refused to go to [facility] stating that this is not a 2 year program and she is wanting to stay at [facility] until a 2 year program can be found and she is accepted.  It was explained to the patient that she has reached maximum benefit from [facility] and would greatly benefit from going to [facility] first and work on getting into a 2 year program while at [facility].  The patient became angry and insulting to the treatment team and demanded immediate discharge.  She stated she did not want to go to [facility] and understood by declining this placement she would lose this opportunity for further substance abuse treatment.  The patient expressed her understanding and again demanded discharge.  She was discharged home to be transported by her advocate Rev Riley Lam.   Treatment Plan Summary:  1. Daily contact with patient to assess and evaluate symptoms and progress in treatment.  2. Medication management  3. The patient will deny suicidal ideations or homicidal ideations for 48 hours prior to discharge and have a depression and anxiety rating of 3 or less. The patient will also deny any auditory or visual hallucinations or delusional thinking.  4. The patient will deny any symptoms of substance withdrawal at time of discharge.   Plan:  1. Will continue the medication Zoloft at 200 mgs po q am for depression and anxiety.  2. Will continue the medication Neurontin at 400 mgs po q am, 2 pm and hs for anxiety and mood stabilization.  3. Will continue the patient on her non-psychiatric medications.  4. Will continue the medication Depakote ER at 1000 mgs po qhs for mood stabilization.  5. Will continue the medication Neurontin 400 mgs po q am, 2 pm and hs for mood stabilization.  6. Will continue the medication Vistaril  at 100 mgs po qhs - prn for sleep.  7. Will continue the medication Seroquel at 400 mgs po qhs for sleep and mood stabilization.  8. Laboratory studies reviewed.  9. Abdominal x-ray for evaluation of possible stool impaction reviewed and is unremarkable. 10. Will continue to monitor.   Suicide Risk:  Minimal: No identifiable suicidal ideation.  Patients presenting with no risk factors but with morbid ruminations; may be classified as minimal risk based on the severity of the depressive symptoms  Plan Of Care/Follow-up recommendations:  Activity:  As tolerated. Diet:  Heart Healthy Diet. Other:  Please take all medications only as directed and keep all scheduled follow up appointments.  Also do not use any alcohol or illicit drugs.    Jenilyn Magana 12/27/2011, 1:48 PM

## 2011-12-28 NOTE — Progress Notes (Signed)
Patient Discharge Instructions:  After Visit Summary (AVS):   Faxed to:  12/28/2011 Psychiatric Admission Assessment Note:   Faxed to:  12/28/2011 Suicide Risk Assessment - Discharge Assessment:   Faxed to:  12/28/2011 Faxed/Sent to the Next Level Care provider:  12/28/2011  Faxed to [facility] @ [phone]  Heloise Purpura Eduard Clos, 12/28/2011, 4:32 PM

## 2012-01-06 NOTE — Discharge Summary (Signed)
Physician Discharge Summary Note  Patient:  Lacey Moses is an 46 y.o., female MRN:  [id number] DOB:  03/14/1966 Patient phone:  [phone] (home)  Patient address:   [address] [place] [address],   Date of Admission:  12/02/2011 Date of Discharge: 12/27/2011   Reason for Admission: Stabilization  Discharge Diagnoses: Principal Problem:  *Major depressive disorder, recurrent Active Problems:  Cocaine abuse  HIV positive  Alcohol dependence  Cannabis abuse  Bacterial vaginosis  Constipation  OCD (obsessive compulsive disorder)   Axis Diagnosis:  Discharge Diagnoses:  AXIS I: Major Depressive Disorder - Recurrent - Severe.  Obsessive Compulsive Disorder.  Alcohol Dependence.  Cocaine Abuse.  Cannabis Abuse.  AXIS II: Cluster B Personality Disorder.  AXIS III: 1. HIV Positive.  2. Hypertension.  3. Hyperlipidemia.  4. Heart Murmur.  5. Gastroesophageal Reflux Disease.  6. H/O Pancreatitis.  AXIS IV: Chronic Mental Illness. Long History of Substance Abuse Issues. Serious Chronic Non-psychiatric Medical Issues. Unemployment. Little Insight Into Mental Health Issues.  AXIS V: GAF at time of admission approximately 35. GAF at time of discharge approximately 60.    Level of Care:  outpatient  Hospital Course:  Lacey Moses was admitted for detox after relapsing on drugs  Consults:  Internal medicine  Significant Diagnostic Studies: see diagnostic studies  Discharge Vitals:   Blood pressure 132/87, pulse 104, temperature 97.8 F (36.6 C), temperature source Oral, resp. rate 20, height 5\' 4"  (1.626 m), weight 78.019 kg (172 lb).  Mental Status Exam: See Mental Status Examination and Suicide Risk Assessment completed by Attending Physician prior to discharge.  Discharge destination:  [facility] course:  Lacey Moses was admitted after she relapsed on drugs after breaking up with her boyfriend.  Her hospital course was long and Lacey Moses was a demanding patient who after  stabilization was admitted to the 400 hall due to her psychosis.  She was tried on a number of medications but was fixated on her bowel habits.  She demanded laxatives as she stated she had been constipated.  She was given a full range of therapeutic options including fiber tablets, suppositories, laxatives and other purgatives.  When she had the expected response she immediately demanded Immodiim AD or other anti-diarrheals.  She was evaluated by Internal Medicine and had repeated Xrays to monitor her stool burden.  She was never impacted or obstructed.  Lacey Moses was very demanding of the staff and fixated on her bowel habits.  Through out her admission her focus would change constantly and progress was difficult to see.  As one problem was solved another would occur impeding Lacey Moses's forward progress.  Upon the day of discharge she had secured a bed at [facility] which was her ultimate goal especially after speaking with her father several times prior to her discharge.  When Lacey Moses was notified that a bed was available for her at [facility], Lacey Moses dismissed any prior knowledge of her agreement to go to [facility] and declined the bed stating that she had never agreed to go there. She had a patient advocate with her who felt that Lacey Moses's needs were not being met appropriately despite extreme efforts on behalf of our Case Manager who had spent a great deal of time working with Lacey Moses who would eventually reverse her course multiple times at the last minute, all to no avail. Lacey Moses was discharged home with plans to follow up as noted.  Discharge Orders    Future Orders Please Complete By Expires   Diet - low sodium heart  healthy      Increase activity slowly      Discharge instructions      Comments:   Please take all medications only as directed and keep all scheduled follow up appointments.  Please abstain from any use of alcohol or illicit drugs.     Medication List  As of 01/06/2012 10:53 PM   STOP taking these medications           ARIPiprazole 10 MG tablet      pravastatin 20 MG tablet      QUEtiapine 400 MG 24 hr tablet         TAKE these medications      Indication    camphor-menthol lotion   Commonly known as: SARNA   Apply topically as needed for itching.       divalproex 500 MG 24 hr tablet   Commonly known as: DEPAKOTE ER   Take 2 tablets (1,000 mg total) by mouth daily at 10 pm. For mood stabilization.       efavirenz-emtrictabine-tenofovir 600-200-300 MG per tablet   Commonly known as: ATRIPLA   Take 1 tablet by mouth daily. For HIV Infection.       fluocinonide 0.05 % external solution   Commonly known as: LIDEX   Apply 1 application topically 2 (two) times daily. For itching.       gabapentin 400 MG capsule   Commonly known as: NEURONTIN   Take 1 capsule (400 mg total) by mouth 3 (three) times daily at 8am, 2pm and bedtime. For anxiety and mood stabilization.       Gerhardt's butt cream Crea   Apply 1 application topically 4 (four) times daily. For skin issues.       hydrochlorothiazide 25 MG tablet   Commonly known as: HYDRODIURIL   Take 1 tablet (25 mg total) by mouth daily. For blood pressure control.       hydrOXYzine 50 MG tablet   Commonly known as: ATARAX/VISTARIL   Take 2 tablets (100 mg total) by mouth at bedtime as needed (For Sleep.).       multivitamin with minerals Tabs   Take 1 tablet by mouth daily. As a nutritional supplement.       polycarbophil 625 MG tablet   Commonly known as: FIBERCON   Take 1 tablet (625 mg total) by mouth daily. For constipation.       QUEtiapine 400 MG tablet   Commonly known as: SEROQUEL   Take 1 tablet (400 mg total) by mouth at bedtime. For sleep and mood stabilization.       sertraline 100 MG tablet   Commonly known as: ZOLOFT   Take 2 tablets (200 mg total) by mouth daily. For depression and OCD.       simvastatin 10 MG tablet   Commonly known as: ZOCOR   Take 1 tablet (10 mg total) by mouth at bedtime. For hyperlipemia.        sodium chloride 0.65 % Soln nasal spray   Commonly known as: OCEAN   Place 1 spray into the nose as needed for congestion (Nasal Dryness.).       tretinoin 0.025 % cream   Commonly known as: RETIN-A   Apply 1 application topically at bedtime. For skin.       valACYclovir 1000 MG tablet   Commonly known as: VALTREX   Take 1 tablet (1,000 mg total) by mouth daily. For Herpes Virus.  Follow-up Information    Call [facility]. (You are on the wait list for a bed, call if you desire to see when bed comes available)    Contact information:   [facility] [address]  [place], [place]  [address] [phone]      Schedule an appointment as soon as possible for a visit with Ulla Gallo, RN. (This nurse will call you to schedule an appointment, or you can call her.)    Contact information:   [facility] [address] [place]  [address] [phone]      Call [facility]. (Continue your efforts as desired to be accepted into this program)    Contact information:   [facility] [address] [place]  [address] [phone]         Follow-up recommendations:  Heart healthy diet, activity as tolerated.  Comments:  Britanni has met the maximum therapeutic benefits offered at [facility]. Should she need admission in the future Azia would best be served at another facility.  Despite heroic above and beyond efforts by our CM, Yoseline's demands were extensive, impossible to meet, likely due to her BPD.    Signed: Rona Ravens. Radin Raptis PAC For Dr. Harvie Heck D. Readling  01/06/2012, 10:53 PM

## 2013-06-14 IMAGING — CR DG CHEST 2V
2 series · 2 of 2 positions shown · non-contrast
Comparison: None.

CLINICAL DATA: Cough, weakness.  HIV.

CHEST - 2 VIEW

[w chest pa]
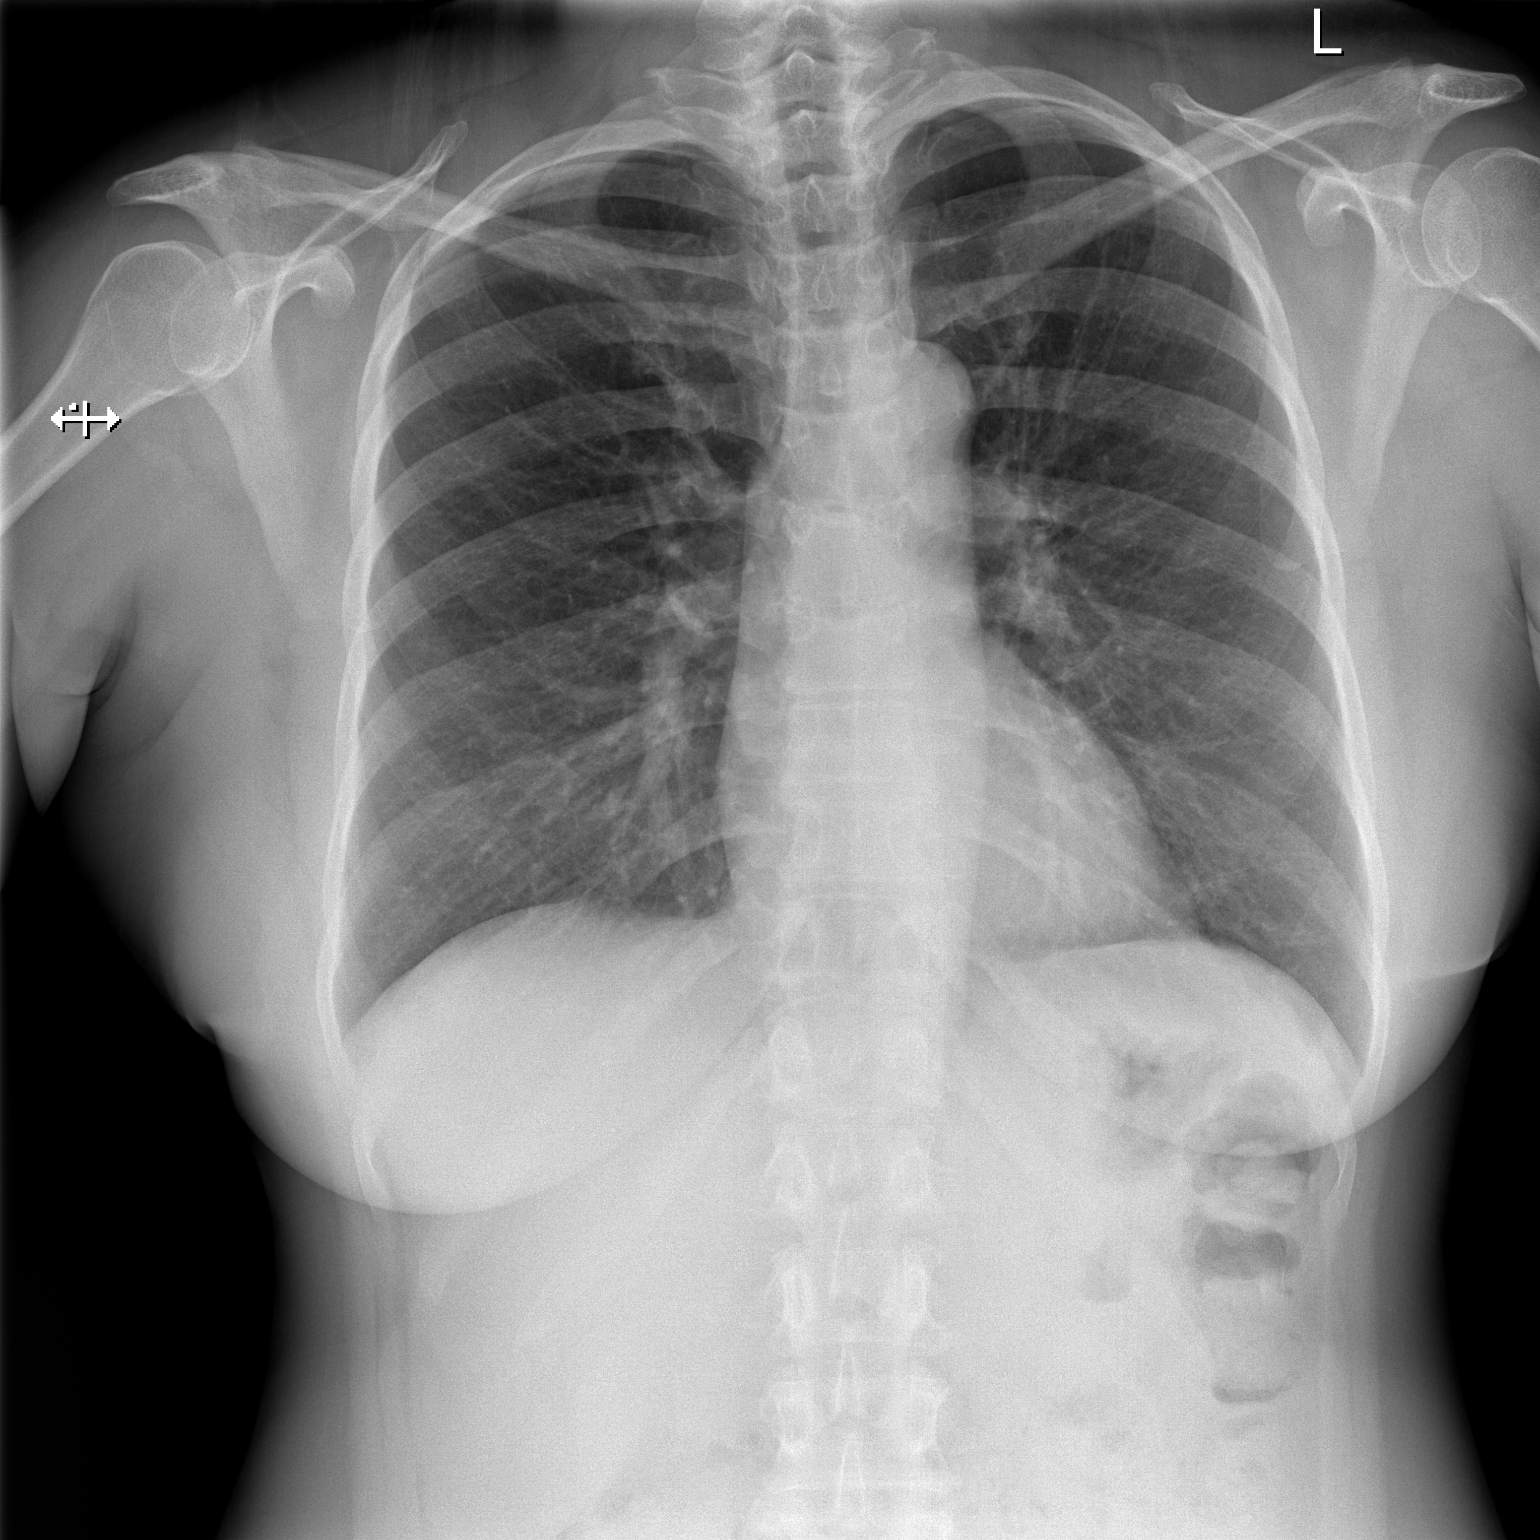

[w chest lat]
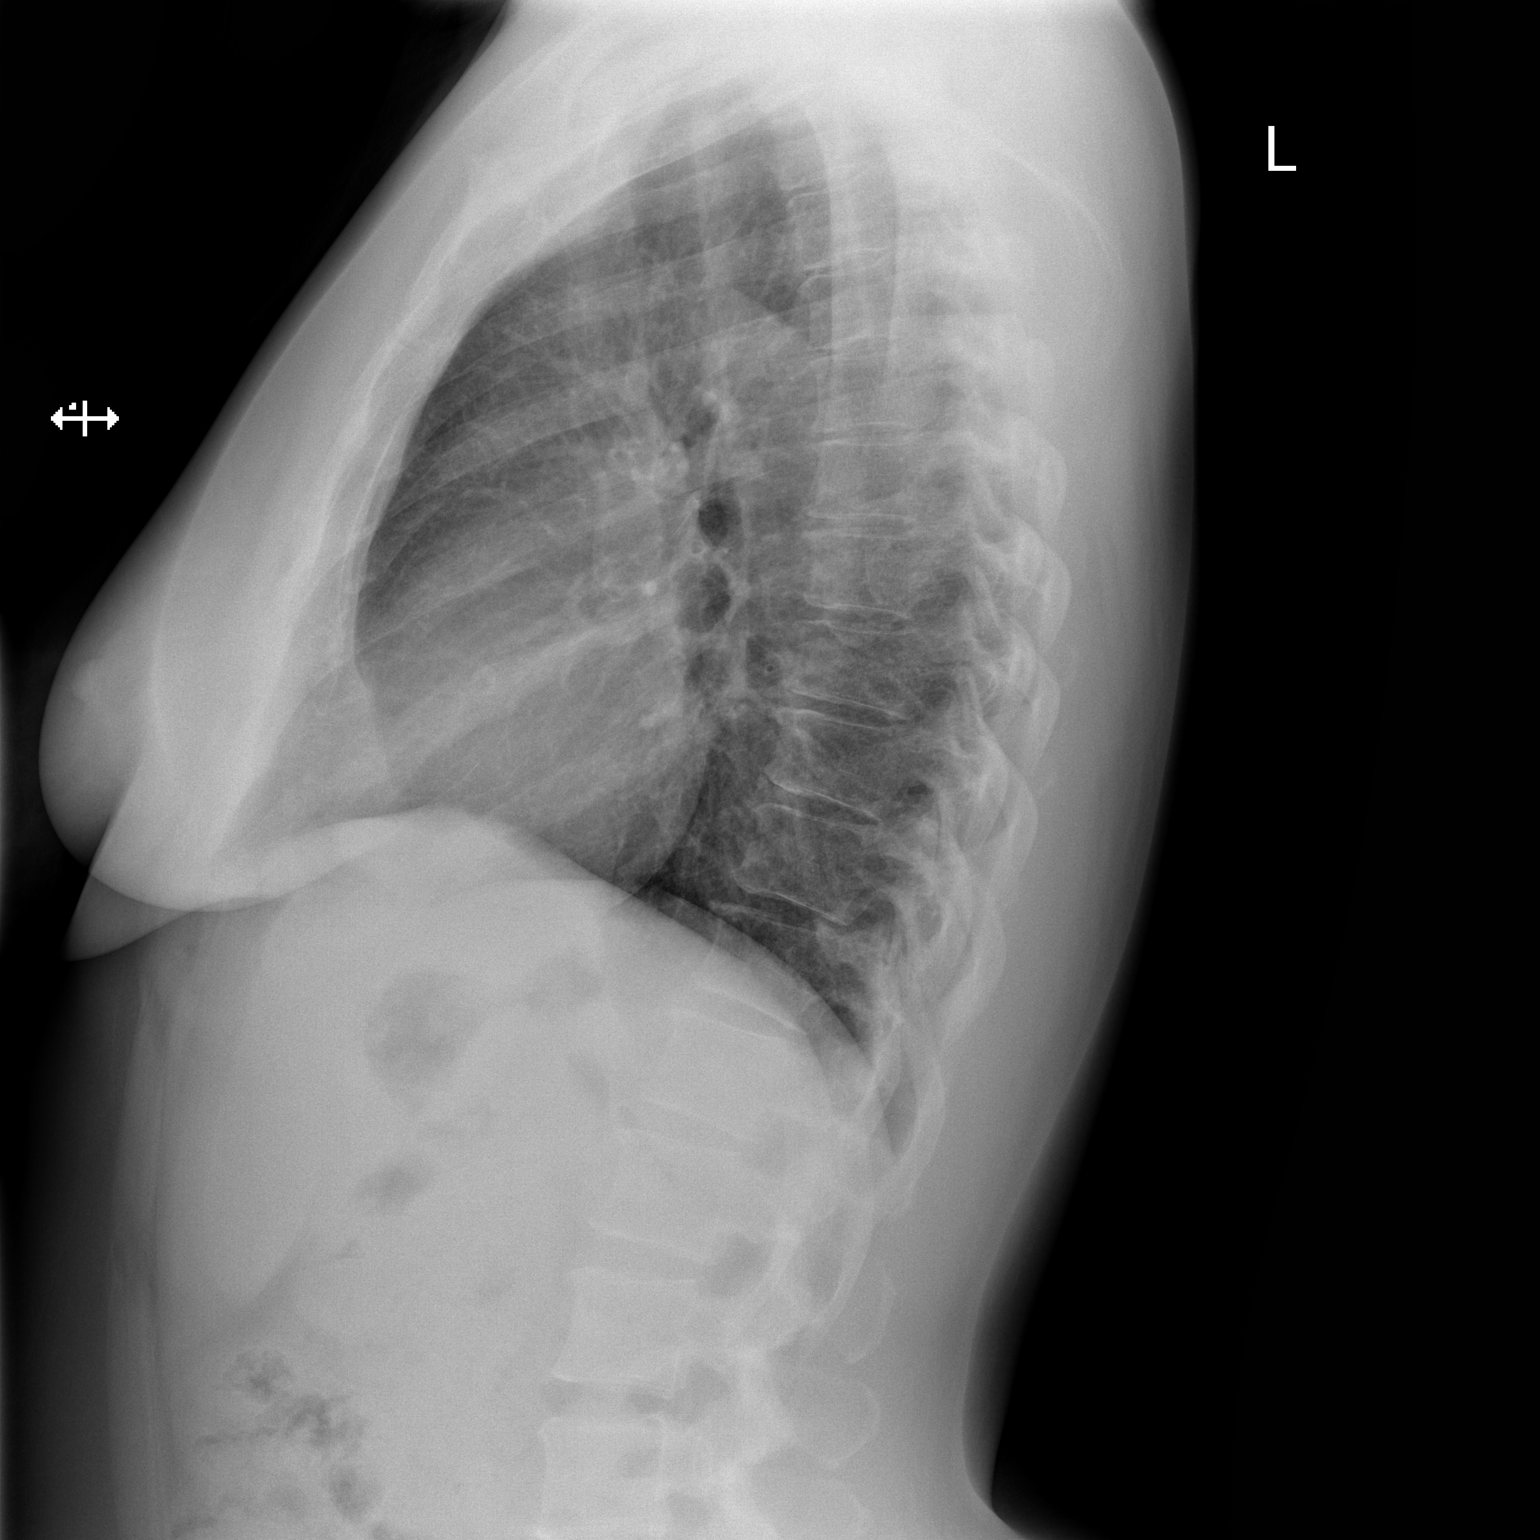

[2 of 2 positions shown; findings below may reference images not displayed]

FINDINGS: Heart is normal in size.  Lungs are free of focal
consolidations and pleural effusions.  There is mild prominence of
interstitial markings.  No edema. Visualized osseous structures
have a normal appearance.
IMPRESSION: No evidence for acute cardiopulmonary abnormality.

## 2013-06-23 IMAGING — CR DG ABDOMEN 2V
2 series · 2 of 2 positions shown · non-contrast
Comparison: None.

CLINICAL DATA: Abdominal pain and bloating.

ABDOMEN - 2 VIEW

[w abdomen upright]
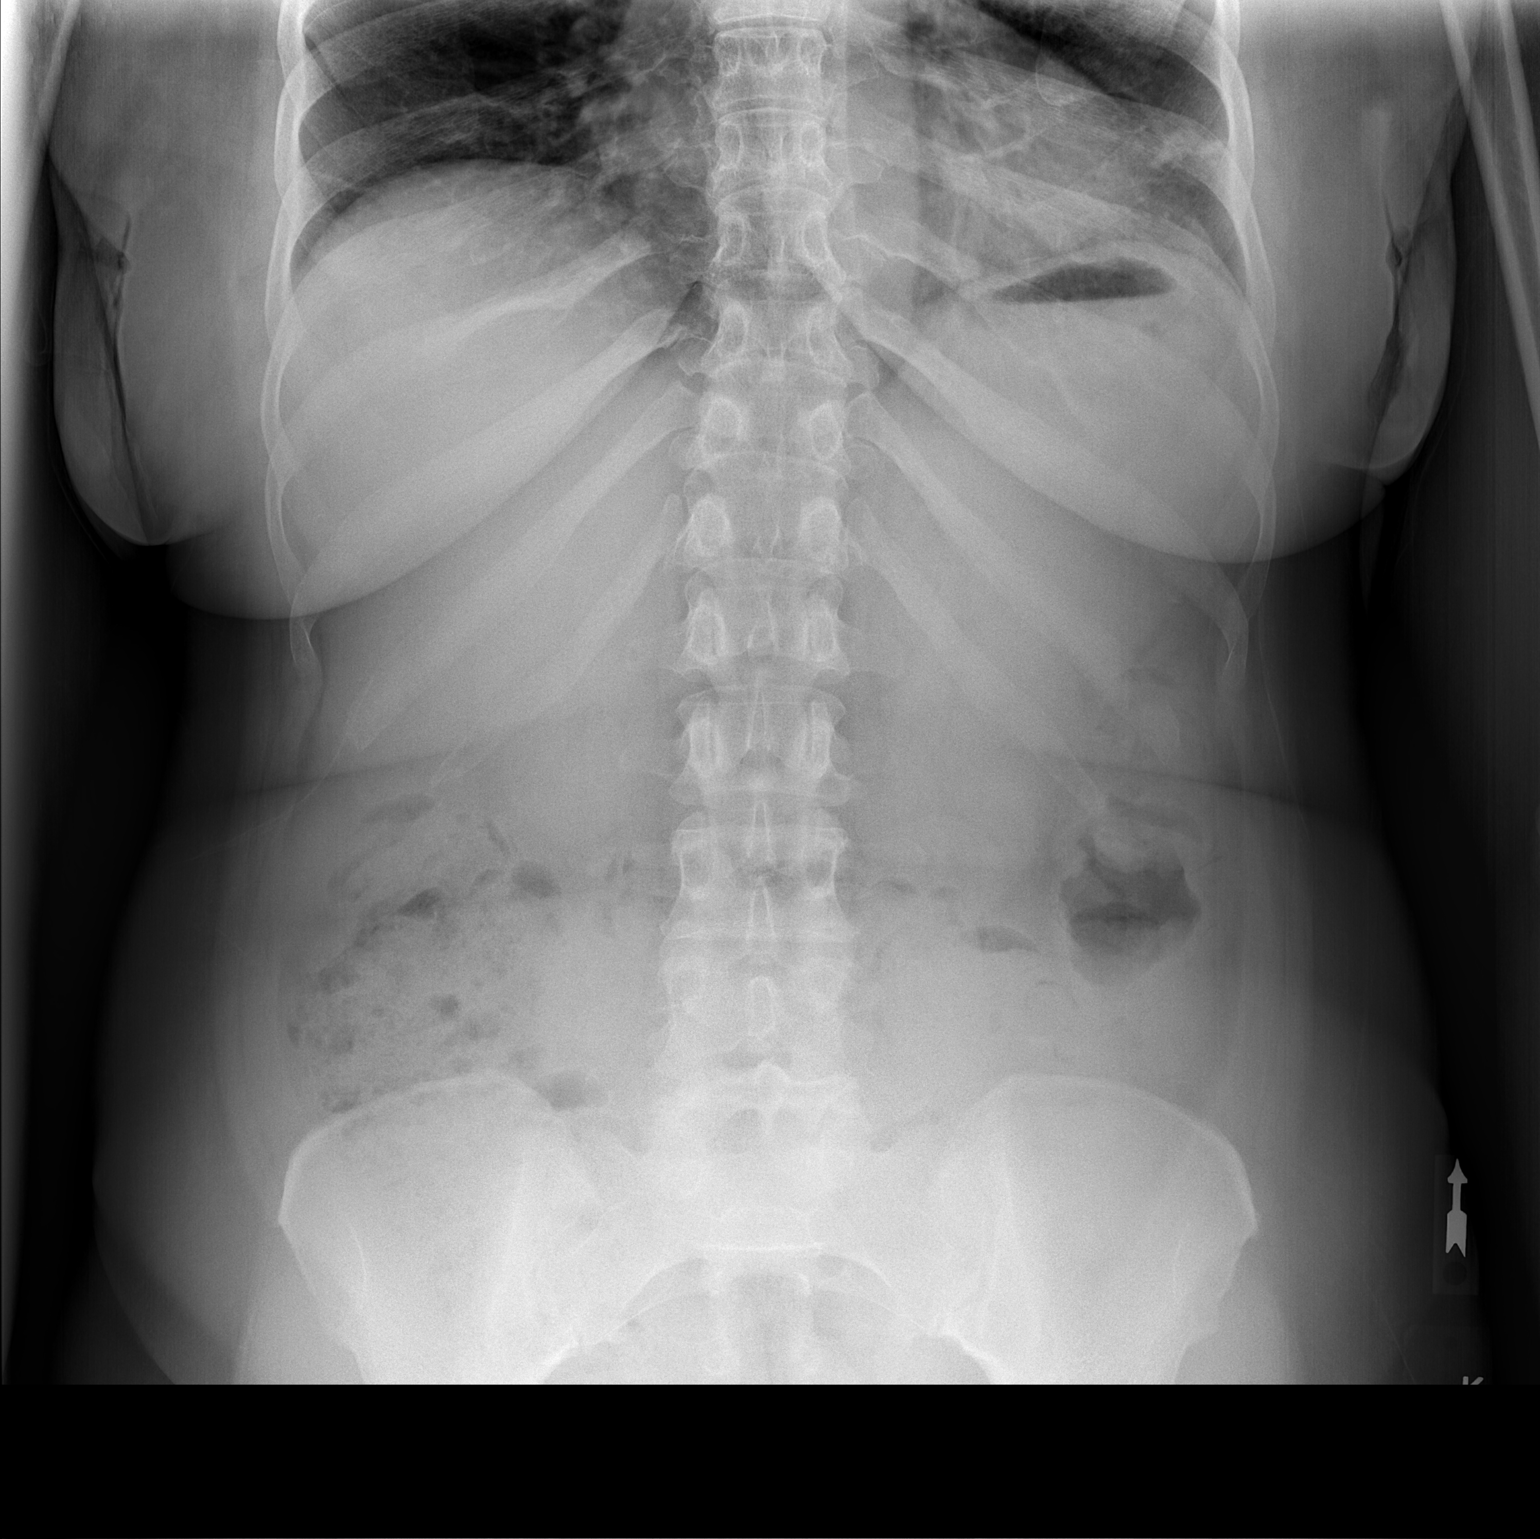

[t abdomen supine]
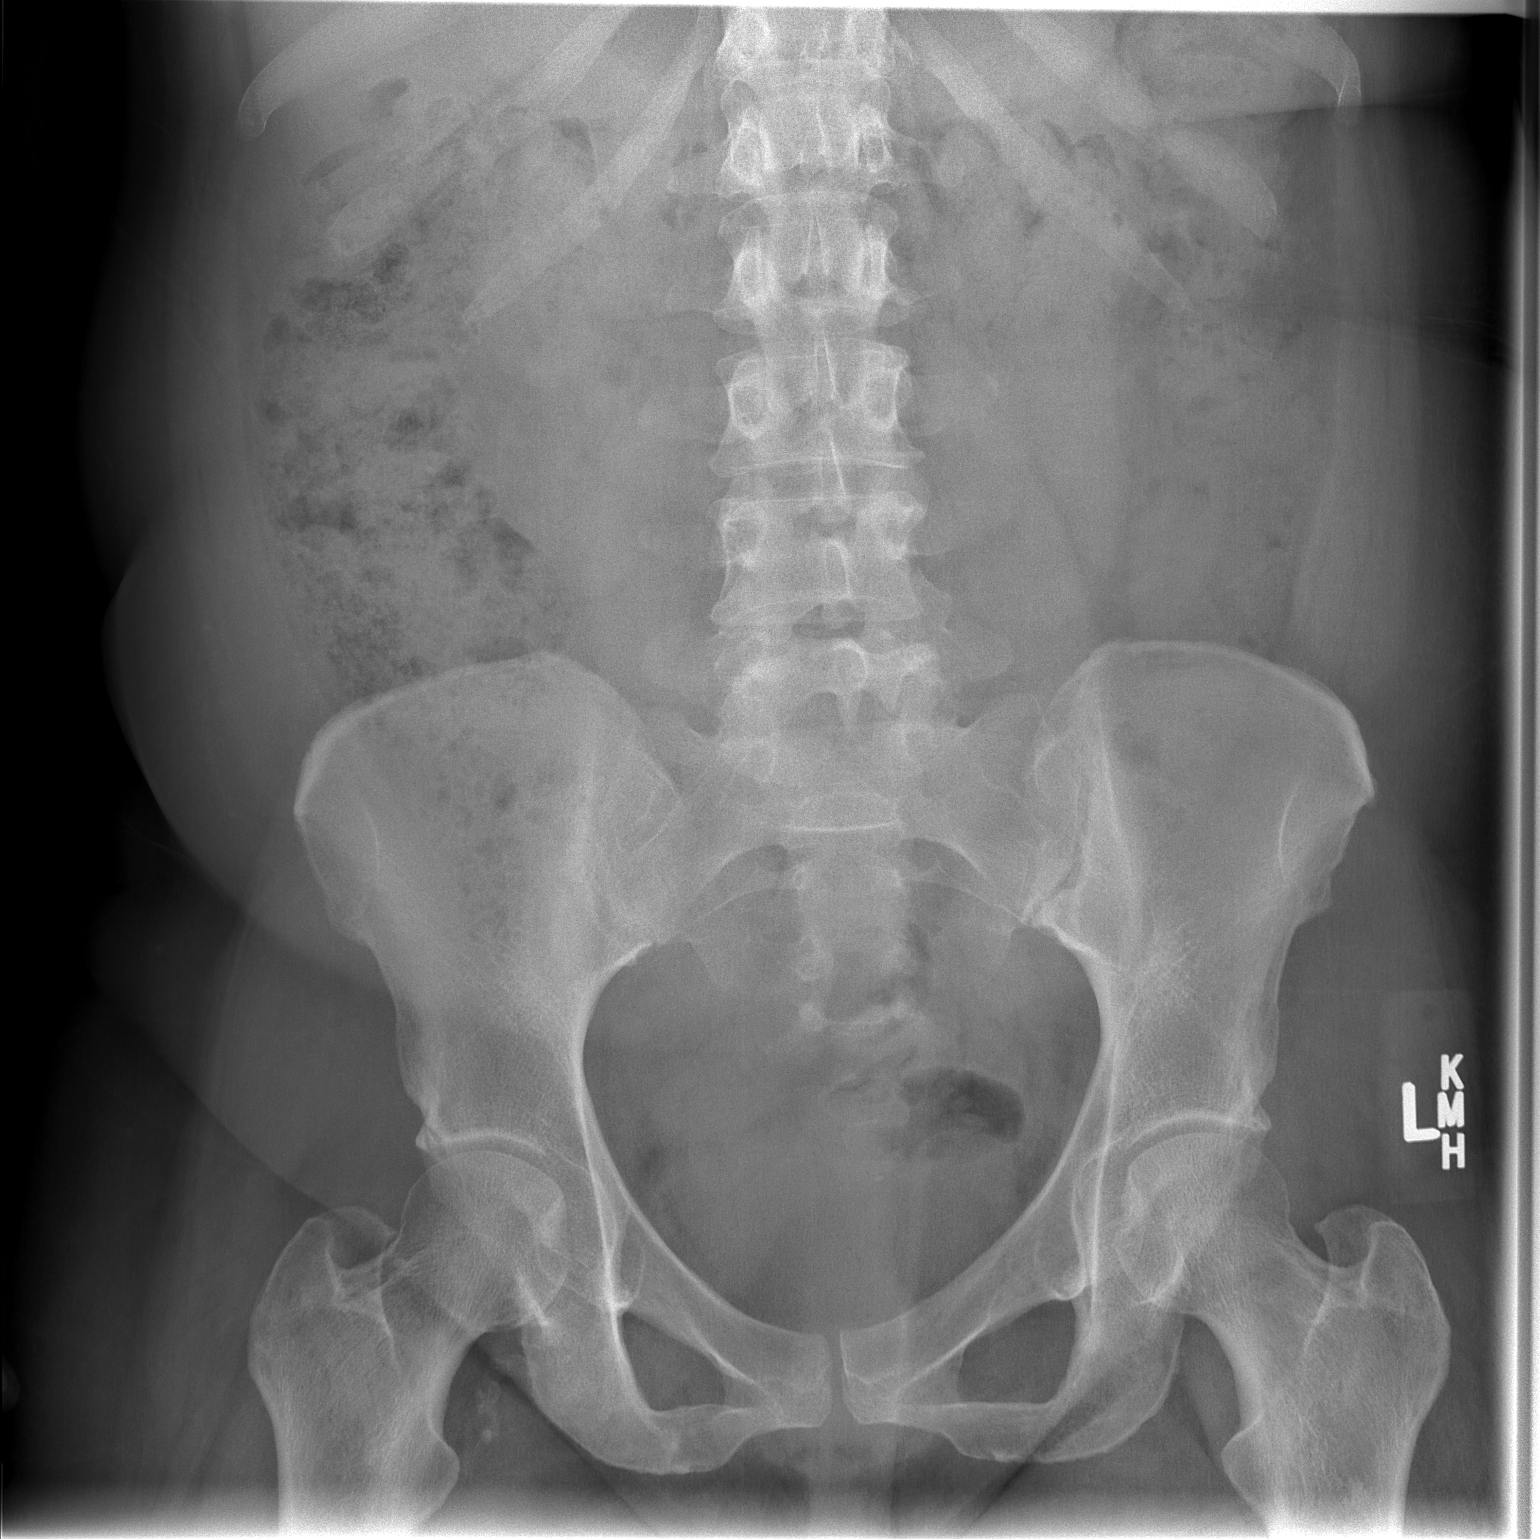

[2 of 2 positions shown; findings below may reference images not displayed]

FINDINGS: There is no free intraperitoneal air. No evidence of
bowel obstruction. Large stool burden noted.
IMPRESSION: No acute finding.  Large stool burden noted.

## 2015-12-21 ENCOUNTER — Encounter: Payer: Self-pay | Admitting: Podiatry

## 2015-12-21 ENCOUNTER — Ambulatory Visit (INDEPENDENT_AMBULATORY_CARE_PROVIDER_SITE_OTHER): Payer: Medicaid Other | Admitting: Podiatry

## 2015-12-21 VITALS — BP 114/82 | HR 70 | Ht 64.0 in | Wt 196.0 lb

## 2015-12-21 DIAGNOSIS — M722 Plantar fascial fibromatosis: Secondary | ICD-10-CM | POA: Diagnosis not present

## 2015-12-21 DIAGNOSIS — L570 Actinic keratosis: Secondary | ICD-10-CM

## 2015-12-21 DIAGNOSIS — M2010 Hallux valgus (acquired), unspecified foot: Secondary | ICD-10-CM

## 2015-12-21 DIAGNOSIS — M21619 Bunion of unspecified foot: Secondary | ICD-10-CM

## 2015-12-21 MED ORDER — OXYCODONE-ACETAMINOPHEN 10-325 MG PO TABS: 1.0000 | ORAL_TABLET | Freq: Four times a day (QID) | ORAL | Status: DC | PRN

## 2015-12-21 NOTE — Progress Notes (Signed)
SUBJECTIVE: 50 y.o. year old female presents complaining of painful calluses on both great toes and heels. Pain in arch of both feet x 2 years. On feet all day doing different things. Patient requests an authorization for diabetic shoes. Disabled from heart condition and HIV since 2016.  HIV since 1994.   Patient is referred by Dr. Julio Sickssei-Bonsu.   REVIEW OF SYSTEMS: A comprehensive review of systems was negative except for: HIV positive, Hypertension, borderline diabetic.   OBJECTIVE: DERMATOLOGIC EXAMINATION: Thick hard calluses under heels and both big toes.   VASCULAR EXAMINATION OF LOWER LIMBS: Pedal pulses: All pedal pulses are palpable with normal pulsation.  Temperature gradient from tibial crest to dorsum of foot is within normal bilateral.  NEUROLOGIC EXAMINATION OF THE LOWER LIMBS: All epicritic and tactile sensations are grossly intact.   MUSCULOSKELETAL EXAMINATION: Positive for bilateral dorsal bunion.  Forefoot varus bilateral.  ASSESSMENT: Hyperkeratosis plantar bilateral. Dorsal bunion bilateral. Plantar fasciitis bilateral. Plantar keratosis.  PLAN: Reviewed clinical findings and available treatment options. All calluses debrided. Advised to get Medical necessity form from her PCP who treats her diabetes and take the form to the provide who prepares diabetic shoes.

## 2015-12-21 NOTE — Patient Instructions (Signed)
Seen for painful callus and arches. Callus debrided. May benefit from custom/otc orthotics.
